# Patient Record
Sex: Female | Born: 1960 | Race: Black or African American | Hispanic: No | Marital: Single | Smoking: Never smoker
Health system: Southern US, Community
[De-identification: ages and names within clinical notes are randomized; demographics above are authoritative.]

## PROBLEM LIST (undated history)

## (undated) DIAGNOSIS — E039 Hypothyroidism, unspecified: Secondary | ICD-10-CM

## (undated) DIAGNOSIS — F988 Other specified behavioral and emotional disorders with onset usually occurring in childhood and adolescence: Secondary | ICD-10-CM

## (undated) DIAGNOSIS — D649 Anemia, unspecified: Secondary | ICD-10-CM

## (undated) DIAGNOSIS — E785 Hyperlipidemia, unspecified: Secondary | ICD-10-CM

## (undated) DIAGNOSIS — R42 Dizziness and giddiness: Secondary | ICD-10-CM

## (undated) DIAGNOSIS — R600 Localized edema: Secondary | ICD-10-CM

## (undated) DIAGNOSIS — I2510 Atherosclerotic heart disease of native coronary artery without angina pectoris: Secondary | ICD-10-CM

## (undated) DIAGNOSIS — M170 Bilateral primary osteoarthritis of knee: Secondary | ICD-10-CM

## (undated) DIAGNOSIS — E079 Disorder of thyroid, unspecified: Secondary | ICD-10-CM

## (undated) DIAGNOSIS — M2550 Pain in unspecified joint: Secondary | ICD-10-CM

## (undated) DIAGNOSIS — C801 Malignant (primary) neoplasm, unspecified: Secondary | ICD-10-CM

## (undated) DIAGNOSIS — Z90710 Acquired absence of both cervix and uterus: Secondary | ICD-10-CM

## (undated) HISTORY — DX: Hypothyroidism, unspecified: E03.9

## (undated) HISTORY — DX: Disorder of thyroid, unspecified: E07.9

## (undated) HISTORY — DX: Pain in unspecified joint: M25.50

## (undated) HISTORY — PX: TONSILLECTOMY: SUR1361

## (undated) HISTORY — DX: Other specified behavioral and emotional disorders with onset usually occurring in childhood and adolescence: F98.8

## (undated) HISTORY — DX: Hyperlipidemia, unspecified: E78.5

## (undated) HISTORY — PX: CATARACT EXTRACTION, BILATERAL: SHX1313

## (undated) HISTORY — PX: COLONOSCOPY: SHX174

## (undated) HISTORY — DX: Dizziness and giddiness: R42

## (undated) HISTORY — PX: ABDOMINAL HYSTERECTOMY: SHX81

## (undated) HISTORY — DX: Bilateral primary osteoarthritis of knee: M17.0

## (undated) HISTORY — DX: Acquired absence of both cervix and uterus: Z90.710

## (undated) HISTORY — DX: Malignant (primary) neoplasm, unspecified: C80.1

## (undated) HISTORY — DX: Localized edema: R60.0

---

## 2004-02-24 ENCOUNTER — Ambulatory Visit (HOSPITAL_COMMUNITY): Admission: RE | Admit: 2004-02-24 | Discharge: 2004-02-24 | Payer: Self-pay | Admitting: Obstetrics & Gynecology

## 2004-03-23 ENCOUNTER — Encounter: Admission: RE | Admit: 2004-03-23 | Discharge: 2004-03-23 | Payer: Self-pay | Admitting: Obstetrics & Gynecology

## 2005-03-29 ENCOUNTER — Encounter: Admission: RE | Admit: 2005-03-29 | Discharge: 2005-03-29 | Payer: Self-pay | Admitting: Obstetrics

## 2006-06-20 ENCOUNTER — Encounter: Admission: RE | Admit: 2006-06-20 | Discharge: 2006-06-20 | Payer: Self-pay | Admitting: Obstetrics

## 2010-03-09 ENCOUNTER — Encounter
Admission: RE | Admit: 2010-03-09 | Discharge: 2010-03-09 | Payer: Self-pay | Source: Home / Self Care | Attending: Family Medicine | Admitting: Family Medicine

## 2011-12-15 ENCOUNTER — Ambulatory Visit (HOSPITAL_COMMUNITY)
Admission: RE | Admit: 2011-12-15 | Discharge: 2011-12-15 | Disposition: A | Discharge status: Home / Self Care | Payer: Worker's Compensation | Source: Ambulatory Visit | Attending: Family Medicine | Admitting: Family Medicine

## 2011-12-15 DIAGNOSIS — M79609 Pain in unspecified limb: Secondary | ICD-10-CM | POA: Insufficient documentation

## 2011-12-15 DIAGNOSIS — M79606 Pain in leg, unspecified: Secondary | ICD-10-CM

## 2011-12-15 DIAGNOSIS — M7989 Other specified soft tissue disorders: Secondary | ICD-10-CM | POA: Insufficient documentation

## 2011-12-15 NOTE — Progress Notes (Signed)
*  PRELIMINARY RESULTS* Vascular Ultrasound Right lower extremity venous duplex has been completed.  Preliminary findings: no evidence of DVT or baker's cyst.  Called report to Toniann Fail at Dr. Prince Rome office.   Farrel Demark, RDMS, RVT  12/15/2011, 11:34 AM

## 2012-07-08 ENCOUNTER — Other Ambulatory Visit: Payer: Self-pay | Admitting: Family Medicine

## 2012-07-08 DIAGNOSIS — E669 Obesity, unspecified: Secondary | ICD-10-CM

## 2012-07-08 DIAGNOSIS — R5383 Other fatigue: Secondary | ICD-10-CM

## 2012-07-09 ENCOUNTER — Ambulatory Visit
Admission: RE | Admit: 2012-07-09 | Discharge: 2012-07-09 | Disposition: A | Discharge status: Home / Self Care | Payer: BC Managed Care – PPO | Source: Ambulatory Visit | Attending: Family Medicine | Admitting: Family Medicine

## 2012-07-09 DIAGNOSIS — R5383 Other fatigue: Secondary | ICD-10-CM

## 2012-07-09 DIAGNOSIS — E669 Obesity, unspecified: Secondary | ICD-10-CM

## 2013-08-25 ENCOUNTER — Other Ambulatory Visit: Payer: Self-pay | Admitting: Family Medicine

## 2013-08-25 DIAGNOSIS — E041 Nontoxic single thyroid nodule: Secondary | ICD-10-CM

## 2013-08-27 ENCOUNTER — Other Ambulatory Visit: Payer: Self-pay

## 2013-08-27 DIAGNOSIS — Z1231 Encounter for screening mammogram for malignant neoplasm of breast: Secondary | ICD-10-CM

## 2013-09-05 ENCOUNTER — Ambulatory Visit
Admission: RE | Admit: 2013-09-05 | Discharge: 2013-09-05 | Disposition: A | Discharge status: Home / Self Care | Payer: BC Managed Care – PPO | Source: Ambulatory Visit | Attending: Family Medicine | Admitting: Family Medicine

## 2013-09-05 ENCOUNTER — Encounter (INDEPENDENT_AMBULATORY_CARE_PROVIDER_SITE_OTHER): Payer: Self-pay

## 2013-09-05 ENCOUNTER — Ambulatory Visit
Admission: RE | Admit: 2013-09-05 | Discharge: 2013-09-05 | Disposition: A | Discharge status: Home / Self Care | Payer: BC Managed Care – PPO | Source: Ambulatory Visit

## 2013-09-05 DIAGNOSIS — Z1231 Encounter for screening mammogram for malignant neoplasm of breast: Secondary | ICD-10-CM

## 2013-09-05 DIAGNOSIS — E041 Nontoxic single thyroid nodule: Secondary | ICD-10-CM

## 2013-09-09 ENCOUNTER — Other Ambulatory Visit: Payer: Self-pay | Admitting: Family Medicine

## 2013-09-09 DIAGNOSIS — R928 Other abnormal and inconclusive findings on diagnostic imaging of breast: Secondary | ICD-10-CM

## 2013-09-17 ENCOUNTER — Ambulatory Visit
Admission: RE | Admit: 2013-09-17 | Discharge: 2013-09-17 | Disposition: A | Discharge status: Home / Self Care | Payer: BC Managed Care – PPO | Source: Ambulatory Visit | Attending: Family Medicine | Admitting: Family Medicine

## 2013-09-17 DIAGNOSIS — R928 Other abnormal and inconclusive findings on diagnostic imaging of breast: Secondary | ICD-10-CM

## 2014-03-11 ENCOUNTER — Other Ambulatory Visit: Payer: Self-pay | Admitting: Family Medicine

## 2014-03-11 DIAGNOSIS — R921 Mammographic calcification found on diagnostic imaging of breast: Secondary | ICD-10-CM

## 2014-03-26 ENCOUNTER — Ambulatory Visit
Admission: RE | Admit: 2014-03-26 | Discharge: 2014-03-26 | Disposition: A | Discharge status: Home / Self Care | Payer: BC Managed Care – PPO | Source: Ambulatory Visit | Attending: Family Medicine | Admitting: Family Medicine

## 2014-03-26 DIAGNOSIS — R921 Mammographic calcification found on diagnostic imaging of breast: Secondary | ICD-10-CM

## 2014-08-31 ENCOUNTER — Other Ambulatory Visit: Payer: Self-pay | Admitting: Family Medicine

## 2014-08-31 DIAGNOSIS — R921 Mammographic calcification found on diagnostic imaging of breast: Secondary | ICD-10-CM

## 2014-09-25 ENCOUNTER — Ambulatory Visit
Admission: RE | Admit: 2014-09-25 | Discharge: 2014-09-25 | Disposition: A | Discharge status: Home / Self Care | Payer: BC Managed Care – PPO | Source: Ambulatory Visit | Attending: Family Medicine | Admitting: Family Medicine

## 2014-09-25 ENCOUNTER — Other Ambulatory Visit: Payer: Self-pay | Admitting: Family Medicine

## 2014-09-25 DIAGNOSIS — R921 Mammographic calcification found on diagnostic imaging of breast: Secondary | ICD-10-CM

## 2015-09-08 ENCOUNTER — Other Ambulatory Visit: Payer: Self-pay | Admitting: Family Medicine

## 2015-09-08 DIAGNOSIS — E042 Nontoxic multinodular goiter: Secondary | ICD-10-CM

## 2015-09-22 ENCOUNTER — Ambulatory Visit
Admission: RE | Admit: 2015-09-22 | Discharge: 2015-09-22 | Disposition: A | Discharge status: Home / Self Care | Payer: BC Managed Care – PPO | Source: Ambulatory Visit | Attending: Family Medicine | Admitting: Family Medicine

## 2015-09-22 DIAGNOSIS — E042 Nontoxic multinodular goiter: Secondary | ICD-10-CM

## 2015-10-25 ENCOUNTER — Other Ambulatory Visit: Payer: Self-pay | Admitting: Internal Medicine

## 2015-10-25 DIAGNOSIS — R921 Mammographic calcification found on diagnostic imaging of breast: Secondary | ICD-10-CM

## 2015-11-04 ENCOUNTER — Other Ambulatory Visit: Payer: Self-pay | Admitting: Family Medicine

## 2015-11-04 ENCOUNTER — Ambulatory Visit
Admission: RE | Admit: 2015-11-04 | Discharge: 2015-11-04 | Disposition: A | Discharge status: Home / Self Care | Payer: BC Managed Care – PPO | Source: Ambulatory Visit | Attending: Internal Medicine | Admitting: Internal Medicine

## 2015-11-04 DIAGNOSIS — R921 Mammographic calcification found on diagnostic imaging of breast: Secondary | ICD-10-CM

## 2016-03-27 ENCOUNTER — Other Ambulatory Visit: Payer: Self-pay | Admitting: Gastroenterology

## 2016-03-27 DIAGNOSIS — R1010 Upper abdominal pain, unspecified: Secondary | ICD-10-CM

## 2016-05-05 ENCOUNTER — Other Ambulatory Visit: Payer: Self-pay

## 2016-05-08 ENCOUNTER — Inpatient Hospital Stay: Admission: RE | Admit: 2016-05-08 | Payer: BC Managed Care – PPO | Source: Ambulatory Visit

## 2016-05-15 ENCOUNTER — Ambulatory Visit: Payer: BC Managed Care – PPO | Admitting: Internal Medicine

## 2016-06-01 ENCOUNTER — Encounter: Payer: Self-pay | Admitting: Internal Medicine

## 2016-06-12 ENCOUNTER — Ambulatory Visit (INDEPENDENT_AMBULATORY_CARE_PROVIDER_SITE_OTHER): Payer: BC Managed Care – PPO | Admitting: Internal Medicine

## 2016-06-12 ENCOUNTER — Encounter (INDEPENDENT_AMBULATORY_CARE_PROVIDER_SITE_OTHER): Payer: Self-pay

## 2016-06-12 ENCOUNTER — Encounter: Payer: Self-pay | Admitting: Internal Medicine

## 2016-06-12 VITALS — BP 142/84 | HR 63 | Ht 63.5 in | Wt 265.8 lb

## 2016-06-12 DIAGNOSIS — R0600 Dyspnea, unspecified: Secondary | ICD-10-CM | POA: Diagnosis not present

## 2016-06-12 DIAGNOSIS — R079 Chest pain, unspecified: Secondary | ICD-10-CM

## 2016-06-12 DIAGNOSIS — R0602 Shortness of breath: Secondary | ICD-10-CM | POA: Diagnosis not present

## 2016-06-12 NOTE — Progress Notes (Signed)
Cardiology Office Note   Date:  06/12/2016   ID:  Kelita Wallis, DOB [birth year], MRN [id number]  DDU:KGUR, Arbie Cookey    Cardiologist:   Dorris Carnes, MD   Pt referred by Beckie Salts for CP      History of Present Illness: Samantha [name] is a 56 y.o. female with a history of GERD  Seen in [facility] There she complained of R sided chest pain  Pressure  Gas-like  On and off  Meds help (nexium)    On talking to the pt she says that she had one signif episode  Went to Urgent care  Bad tightness in center of chest  Lasted 5 or 6 days Continous   Rx Nexium Pt is worried because it lasted for a long time   Continued on Nexium  No tightness   Pt says she does get Fatigue and SOB with activties  If walks a mile or up stairs will get SOB    Has been on lipitor for a couple years  Doesn't take every day as it makes her joiints hurt    Current Meds  Medication Sig  . acetaminophen (TYLENOL ARTHRITIS PAIN) 650 MG CR tablet Take 650 mg by mouth 2 (two) times daily.  Marland Kitchen atorvastatin (LIPITOR) 20 MG tablet Take 20 mg by mouth daily.  Marland Kitchen esomeprazole (NEXIUM) 40 MG capsule Take 40 mg by mouth daily at 12 noon.  Marland Kitchen levothyroxine (SYNTHROID, LEVOTHROID) 88 MCG tablet Take 88 mcg by mouth daily before breakfast.  . nystatin cream (MYCOSTATIN) Apply 1 application topically 2 (two) times daily.     Allergies:   Patient has no known allergies.   Past Medical History:  Diagnosis Date  . Cancer [facility])    Colon Cancer adn polyps  . History of hysterectomy    patrial hysterectomy  . Hyperlipidemia   . Hypothyroidism (acquired)    Multiple thyroid nodules  . Thyroid disease    Hypothyroidism  . Vertigo     History reviewed. No pertinent surgical history.   Social History:  The patient  reports that she has never smoked. She has never used smokeless tobacco. She reports that she does not drink alcohol or use drugs.   Family History:  The patient's    FHx :  On fathers side grandparnts had heart dz No CAD in mom  or dad  Chol elevated in monm     ROS:  Please see the history of present illness. All other systems are reviewed and  Negative to the above problem except as noted.   Occasional food gets stuck     PHYSICAL EXAM: VS:  BP (!) 142/84   Pulse 63   Ht 5' 3.5" (1.613 m)   Wt 265 lb 12.8 oz (120.6 kg)   BMI 46.35 kg/m   GEN: Well nourished, well developed, in no acute distress  HEENT: normal  Neck: no JVD, carotid bruits, or masses Cardiac: RRR; no murmurs, rubs, or gallops,no edema  Respiratory:  clear to auscultation bilaterally, normal work of breathing GI: soft, nontender, nondistended, + BS  No hepatomegaly  MS: no deformity Moving all extremities   Skin: warm and dry, no rash Neuro:  Strength and sensation are intact Psych: euthymic mood, full affect   EKG:  EKG is ordered today.  SR 63 bpm      Lipid Panel No results found for: CHOL, TRIG, HDL, CHOLHDL, VLDL, LDLCALC, LDLDIRECT    Wt Readings from Last 3 Encounters:  06/12/16  265 lb 12.8 oz (120.6 kg)      ASSESSMENT AND PLAN:  1  CP   Atypical I think GI  I do not think it represented angina  2  SOB  Will set up for echo to eval diastolic properties  Consider stress test to evaluate exercise tolerance, BP response  3  HL  Will get lipids    2  BP  This is a little high she says  Usually runs lower  Will need to be followed    5.  Obesity  Discussed diet and wt loss.    F/U based on test results    Current medicines are reviewed at length with the patient today.  The patient does not have concerns regarding medicines.  Signed, Dorris Carnes, MD  06/12/2016 12:21 PM    [address], [place], [place]  [address] Phone: [phone]; Fax: [phone]

## 2016-06-12 NOTE — Patient Instructions (Signed)

## 2016-06-26 ENCOUNTER — Ambulatory Visit (HOSPITAL_COMMUNITY): Payer: BC Managed Care – PPO | Attending: Cardiovascular Disease

## 2016-06-26 ENCOUNTER — Other Ambulatory Visit: Payer: Self-pay

## 2016-06-26 DIAGNOSIS — R0602 Shortness of breath: Secondary | ICD-10-CM

## 2016-06-26 DIAGNOSIS — E785 Hyperlipidemia, unspecified: Secondary | ICD-10-CM | POA: Diagnosis not present

## 2016-06-28 ENCOUNTER — Telehealth: Payer: Self-pay | Admitting: Internal Medicine

## 2016-06-28 NOTE — Telephone Encounter (Signed)
Tried to call pt   Received labs from primary MD  LDL was 166 in Feb  HDL 54  The LDL has had extreme variations 116 to 214.   She needs to work to get this down She is on lipitor 20  If taking regularly I would inreased to 80  Goal LDL at least 100.  Would need f/u lipids in 8 wks

## 2016-06-30 ENCOUNTER — Telehealth: Payer: Self-pay | Admitting: *Deleted

## 2016-06-30 DIAGNOSIS — R0602 Shortness of breath: Secondary | ICD-10-CM

## 2016-06-30 DIAGNOSIS — E785 Hyperlipidemia, unspecified: Secondary | ICD-10-CM

## 2016-06-30 NOTE — Telephone Encounter (Signed)
Patient only takes Lipitor 1-2 times per week at most because it causes her joints to hurt.   Reviewed LDL with her.   Advised Dr. Harrington Challenger may want to try another medication.  Reviewed dietary recommendations for lowering LDL.  She is aware we will call her back after Dr. Harrington Challenger reviews.

## 2016-06-30 NOTE — Telephone Encounter (Signed)
Fay Records, MD      06/28/16 8:43 PM  Note    Tried to call pt   Received labs from primary MD  LDL was 166 in Feb  HDL 54  The LDL has had extreme variations 116 to 214.   She needs to work to get this down She is on lipitor 20  If taking regularly I would inreased to 80  Goal LDL at least 100.  Would need f/u lipids in 8 wks

## 2016-06-30 NOTE — Telephone Encounter (Signed)
Try Crestor 10 mg 3x per wk  Very different metabolism than lipitor   F/U lipids in 8 wks

## 2016-06-30 NOTE — Telephone Encounter (Signed)
-----   Message from Samantha Carnes V, MD sent at 06/27/2016  4:43 PM EDT ----- Echo shows normal pumping and relaxing funciton of the heart  Valves normal I would set up for GXT to eval BP and HR response and exercise capacity

## 2016-07-03 MED ORDER — ROSUVASTATIN CALCIUM 10 MG PO TABS: ORAL_TABLET | ORAL | 3 refills | Status: DC

## 2016-07-03 NOTE — Telephone Encounter (Signed)
Left detailed message (DPR) that Dr. Harrington Challenger recommends Crestor 10 mg three times per week.  Advised [facility] would [facility] and we would plan to check lipids in 8 weeks.   Advised to call back if any questions.

## 2016-08-31 ENCOUNTER — Encounter: Payer: Self-pay | Admitting: Internal Medicine

## 2017-01-11 ENCOUNTER — Other Ambulatory Visit: Payer: Self-pay | Admitting: Family Medicine

## 2017-01-11 DIAGNOSIS — Z1231 Encounter for screening mammogram for malignant neoplasm of breast: Secondary | ICD-10-CM

## 2017-02-02 ENCOUNTER — Ambulatory Visit
Admission: RE | Admit: 2017-02-02 | Discharge: 2017-02-02 | Disposition: A | Discharge status: Home / Self Care | Payer: BC Managed Care – PPO | Source: Ambulatory Visit | Attending: Family Medicine | Admitting: Family Medicine

## 2017-02-02 DIAGNOSIS — Z1231 Encounter for screening mammogram for malignant neoplasm of breast: Secondary | ICD-10-CM

## 2017-03-05 ENCOUNTER — Telehealth: Payer: Self-pay | Admitting: Internal Medicine

## 2017-03-05 DIAGNOSIS — R0602 Shortness of breath: Secondary | ICD-10-CM

## 2017-03-05 NOTE — Telephone Encounter (Signed)
Go ahead and sched GXT Check lipids 2 months after taking statin regularly

## 2017-03-05 NOTE — Telephone Encounter (Signed)
LMTCB

## 2017-03-05 NOTE — Telephone Encounter (Signed)
I spoke with patient--pt states she has not been taking rosuvastatin  10 mg 3 X week regularly, she has not had lab done since lab done in Feb 2018 scanned in Epic.  Pt states she will take rosuvastatin 10 mg 3 X week, repeat fasting lipid profile scheduled for 04/29/17.  Pt states she was aware Dr Harrington Challenger recommended GXT but never got a call from out office to schedule. Pt is ready to schedule GXT, states no new cardiac symptoms, some knee pain but she thinks she can walk on treadmill.  Pt advised I will forward to Dr Harrington Challenger for review and okay to go ahead and schedule GXT since it has been several months since first recommended GXT, pt verbalized understanding.

## 2017-03-05 NOTE — Telephone Encounter (Signed)
°  New message ° °Pt verbalized that she is returning call for the rn  °

## 2017-03-05 NOTE — Telephone Encounter (Signed)
New message   Patient calling for lab results from Lipid panel from March. Patient also wants to discuss getting stress test. Please call

## 2017-03-05 NOTE — Telephone Encounter (Signed)
Rodman Key, RN     2:20 PM  Note    Left detailed message (DPR) that Dr. Harrington Challenger recommends Crestor 10 mg three times per week.  Advised [facility] and we would plan to check lipids in 8 weeks.   Advised to call back if any questions.         2:20 PM     Rodman Key, RN contacted Samantha, [name]  June 30, 2016  Fay Records, MD  to Rodman Key, RN    Note    Try Crestor 10 mg 3x per wk  Very different metabolism than lipitor   F/U lipids in 8 wks

## 2017-03-05 NOTE — Telephone Encounter (Signed)
Note    ----- Message from Fay Records, MD sent at 06/27/2016  4:43 PM EDT ----- Echo shows normal pumping and relaxing funciton of the heart  Valves normal I would set up for GXT to eval BP and HR response and exercise capacity

## 2017-03-06 NOTE — Telephone Encounter (Signed)
LMTCB

## 2017-03-06 NOTE — Telephone Encounter (Signed)
Discussed with patient, reviewed instructions for GXT with patient, will forward message to [facility] to contact pt to schedule.

## 2017-03-07 NOTE — Telephone Encounter (Signed)
GXT is scheduled for 03/16/17.  Pt has made appointment to repeat lipid panel on 04/30/17.

## 2017-03-16 ENCOUNTER — Ambulatory Visit (INDEPENDENT_AMBULATORY_CARE_PROVIDER_SITE_OTHER): Payer: BC Managed Care – PPO

## 2017-03-16 DIAGNOSIS — R0602 Shortness of breath: Secondary | ICD-10-CM

## 2017-03-16 LAB — EXERCISE TOLERANCE TEST
CHL CUP RESTING HR STRESS: 77 {beats}/min
CHL RATE OF PERCEIVED EXERTION: 16
CSEPED: 3 min
CSEPEW: 4.6 METS
Exercise duration (sec): 0 s
MPHR: 162 {beats}/min
Peak HR: 148 {beats}/min
Percent HR: 91 %

## 2017-03-19 ENCOUNTER — Telehealth: Payer: Self-pay | Admitting: *Deleted

## 2017-03-19 MED ORDER — AMLODIPINE BESYLATE 2.5 MG PO TABS: 2.5000 mg | ORAL_TABLET | Freq: Every day | ORAL | 3 refills | Status: DC

## 2017-03-19 NOTE — Telephone Encounter (Signed)
Spoke with pt and went over results and recommendations per Dr. Harrington Challenger.  Pt verbalized understanding and was in agreement with this plan.  Sent prescription to pt's pharmacy and scheduled her to see Dr. Harrington Challenger in March.  Pt appreciative for call.

## 2017-03-19 NOTE — Telephone Encounter (Signed)
-----   Message from Fay Records, MD sent at 03/17/2017  6:43 PM EST ----- Exercise stress test:  No EKG evid for ischemia or poor blood flow to heart Very poor exercise tolerance  Blood pressure did jump up fast Recomm:  1.  2.5 mg amlodipine 2  Pt needs to start walking more often 3  F/U appt in march

## 2017-04-30 ENCOUNTER — Other Ambulatory Visit: Payer: BC Managed Care – PPO

## 2017-05-09 ENCOUNTER — Telehealth: Payer: Self-pay | Admitting: Internal Medicine

## 2017-05-09 NOTE — Telephone Encounter (Signed)
See result note labs 04/27/17.

## 2017-05-09 NOTE — Telephone Encounter (Signed)
New Message   Patient is returning call in reference to recommendations from Dr. Harrington Challenger. Please call to discuss.

## 2017-05-15 ENCOUNTER — Ambulatory Visit (INDEPENDENT_AMBULATORY_CARE_PROVIDER_SITE_OTHER): Payer: BC Managed Care – PPO | Admitting: Pharmacist

## 2017-05-15 ENCOUNTER — Encounter: Payer: Self-pay | Admitting: Pharmacist

## 2017-05-15 DIAGNOSIS — E785 Hyperlipidemia, unspecified: Secondary | ICD-10-CM

## 2017-05-15 NOTE — Patient Instructions (Addendum)
We will pursue Repatha for cholesterol treatment. Call [phone] with questions or concerns.    Cholesterol Cholesterol is a fat. Your body needs a small amount of cholesterol. Cholesterol (plaque) may build up in your blood vessels (arteries). That makes you more likely to have a heart attack or stroke. You cannot feel your cholesterol level. Having a blood test is the only way to find out if your level is high. Keep your test results. Work with your doctor to keep your cholesterol at a good level. What do the results mean?  Total cholesterol is how much cholesterol is in your blood.  LDL is bad cholesterol. This is the type that can build up. Try to have low LDL.  HDL is good cholesterol. It cleans your blood vessels and carries LDL away. Try to have high HDL.  Triglycerides are fat that the body can store or burn for energy. What are good levels of cholesterol?  Total cholesterol below 200.  LDL below 100 is good for people who have health risks. LDL below 70 is good for people who have very high risks.  HDL above 40 is good. It is best to have HDL of 60 or higher.  Triglycerides below 150. How can I lower my cholesterol? Diet Follow your diet program as told by your doctor.  Choose fish, white meat chicken, or [place] that is roasted or baked. Try not to eat red meat, fried foods, sausage, or lunch meats.  Eat lots of fresh fruits and vegetables.  Choose whole grains, beans, pasta, potatoes, and cereals.  Choose olive oil, corn oil, or canola oil. Only use small amounts.  Try not to eat butter, mayonnaise, shortening, or palm kernel oils.  Try not to eat foods with trans fats.  Choose low-fat or nonfat dairy foods. ? Drink skim or nonfat milk. ? Eat low-fat or nonfat yogurt and cheeses. ? Try not to drink whole milk or cream. ? Try not to eat ice cream, egg yolks, or full-fat cheeses.  Healthy desserts include angel food cake, ginger snaps, animal crackers, hard  candy, popsicles, and low-fat or nonfat frozen yogurt. Try not to eat pastries, cakes, pies, and cookies.  Exercise Follow your exercise program as told by your doctor.  Be more active. Try gardening, walking, and taking the stairs.  Ask your doctor about ways that you can be more active.  Medicine  Take over-the-counter and prescription medicines only as told by your doctor. This information is not intended to replace advice given to you by your health care provider. Make sure you discuss any questions you have with your health care provider. Document Released: 06/02/2008 Document Revised: 10/06/2015 Document Reviewed: 09/16/2015 Elsevier Interactive Patient Education  [employer].

## 2017-05-15 NOTE — Progress Notes (Signed)
Patient ID: Samantha [name]                 DOB: May 22, 1960                    MRN: [id number]     HPI: Samantha [name] is a 57 y.o. female patient of Dr. Harrington Challenger that presents today for lipid evaluation.  PMH includes hyperlipidemia. She reports that her mother had high cholesterol and her son was just diagnosed with high cholesterol as well. Her current 10 year risk of ASCVD is 11.5%, indicating that aggressive cholesterol lowering is warranted.   She presents today for discussion of cholesterol. She reports that she has terrible sleeping habits and has been worried about her cholesterol. She reports that she develops whole body aches when she takes statin medications even intermittently. She states this body aching is intolerable. She reports that these aches resolve completely when the medications are stopped.   Risk Factors: hypertension  LDL Goal: <100 (with ASCVD 10-year risk   Current Medications: none Intolerances: Crestor 10mg  daily and every other day (muscle and whole body to hurt), atorvastatin 20mg  daily (muscle and whole body to hurt) - symptoms resolved after discontinuation   Diet: Endorses eating out a lot. She has tried to do better, but it is hard with traveling for work. She eats mostly at sit down restaurants and some fast food. She does eat red meats and chicken. She eats meats grilled mostly. She avoids butter and tries to be mindful of cholesterol intake. She does eat vegetables regularly. She drinks mostly water or coffee with creamer and sometimes sugar (generally 4- 5 cups per day). She admits that portion size can be problematic.   Exercise: No exercise. She would like to start.   Family History: On fathers side grandparents had heart dz, No CAD in mom or dad,  Chol elevated in mom. Son just diagnosed with high cholesterol.  Social History: The patient  reports that she has never smoked. She has never used smokeless tobacco. She reports that she does not drink alcohol or  use drugs.  Labs: 04/27/17: TC 284, TG 127, HDL 52, LDL 207, nonHDL 232 (Crestor 10mg  three times a week - had been taking intermittently due to side effects)   Past Medical History:  Diagnosis Date  . Cancer [facility])    Colon Cancer adn polyps  . History of hysterectomy    patrial hysterectomy  . Hyperlipidemia   . Hypothyroidism (acquired)    Multiple thyroid nodules  . Thyroid disease    Hypothyroidism  . Vertigo     Current Outpatient Medications on File Prior to Visit  Medication Sig Dispense Refill  . acetaminophen (TYLENOL ARTHRITIS PAIN) 650 MG CR tablet Take 650 mg by mouth 2 (two) times daily.    Marland Kitchen amLODipine (NORVASC) 2.5 MG tablet Take 1 tablet (2.5 mg total) by mouth daily. 90 tablet 3  . esomeprazole (NEXIUM) 40 MG capsule Take 40 mg by mouth daily at 12 noon.    Marland Kitchen levothyroxine (SYNTHROID, LEVOTHROID) 88 MCG tablet Take 88 mcg by mouth daily before breakfast.    . nystatin cream (MYCOSTATIN) Apply 1 application topically 2 (two) times daily.    . rosuvastatin (CRESTOR) 10 MG tablet Take 10 mg three times per week 30 tablet 3  . Vitamin D, Ergocalciferol, (DRISDOL) 50000 units CAPS capsule TAKE 1 CAPSULE ONCE WEEKLY FOR 8 WEEKS ORALLY 60 DAYS  0   No current facility-administered medications on file  prior to visit.     No Known Allergies  Assessment/Plan: Hyperlipidemia: LDL not at goal <100 for aggressive management in primary hyperlipidemia. She is statin difficult and Zetia monotherapy is unlikely to get LDL to goal. Will pursue Repatha for LDL lowering and CVD risk reduction. Patient educated on injection technique, side effects and benefits of therapy. We will contact her once coverage obtained from insurance.    Thank you,  Lelan Pons. Patterson Hammersmith, [facility]  05/15/2017 7:00 AM

## 2017-05-17 ENCOUNTER — Telehealth: Payer: Self-pay | Admitting: Pharmacist

## 2017-05-17 DIAGNOSIS — E782 Mixed hyperlipidemia: Secondary | ICD-10-CM

## 2017-05-17 MED ORDER — EVOLOCUMAB 140 MG/ML ~~LOC~~ SOAJ: 140.0000 mg | SUBCUTANEOUS | 3 refills | Status: DC

## 2017-05-17 NOTE — Telephone Encounter (Signed)
Pt approved for Repatha. Will send to [facility] per patient request. She would like to come to office to do first injection and will call when she has received medication.

## 2017-06-07 ENCOUNTER — Ambulatory Visit: Payer: BC Managed Care – PPO | Admitting: Internal Medicine

## 2017-06-07 DIAGNOSIS — R0989 Other specified symptoms and signs involving the circulatory and respiratory systems: Secondary | ICD-10-CM

## 2017-06-07 NOTE — Telephone Encounter (Signed)
LMOM for pt to see if she has started Repatha injections yet. Will need f/u lab work scheduled.

## 2017-06-08 ENCOUNTER — Encounter: Payer: Self-pay | Admitting: Internal Medicine

## 2017-07-10 NOTE — Addendum Note (Signed)
Addended by: Lathaniel Legate E on: 07/10/2017 02:45 PM   Modules accepted: Orders

## 2017-07-10 NOTE — Telephone Encounter (Signed)
Called pt to follow up with Repatha. She states she has completed 1 injection about 2 weeks ago with no adverse effects. Scheduled f/u lab work after 3-4 injections to assess efficacy.

## 2017-07-31 ENCOUNTER — Ambulatory Visit
Admission: RE | Admit: 2017-07-31 | Discharge: 2017-07-31 | Disposition: A | Discharge status: Home / Self Care | Payer: BC Managed Care – PPO | Source: Ambulatory Visit | Attending: Family Medicine | Admitting: Family Medicine

## 2017-07-31 ENCOUNTER — Other Ambulatory Visit: Payer: Self-pay | Admitting: Family Medicine

## 2017-07-31 DIAGNOSIS — M545 Low back pain: Secondary | ICD-10-CM

## 2017-08-09 ENCOUNTER — Other Ambulatory Visit: Payer: BC Managed Care – PPO | Admitting: *Deleted

## 2017-08-09 DIAGNOSIS — E782 Mixed hyperlipidemia: Secondary | ICD-10-CM

## 2017-08-09 LAB — HEPATIC FUNCTION PANEL
ALK PHOS: 80 IU/L (ref 39–117)
ALT: 20 IU/L (ref 0–32)
AST: 18 IU/L (ref 0–40)
Albumin: 3.8 g/dL (ref 3.5–5.5)
Bilirubin Total: 0.2 mg/dL (ref 0.0–1.2)
Bilirubin, Direct: 0.1 mg/dL (ref 0.00–0.40)
Total Protein: 6.4 g/dL (ref 6.0–8.5)

## 2017-08-09 LAB — LIPID PANEL
CHOLESTEROL TOTAL: 143 mg/dL (ref 100–199)
Chol/HDL Ratio: 2.8 ratio (ref 0.0–4.4)
HDL: 52 mg/dL (ref >39)
LDL CALC: 71 mg/dL (ref 0–99)
TRIGLYCERIDES: 100 mg/dL (ref 0–149)
VLDL Cholesterol Cal: 20 mg/dL (ref 5–40)

## 2017-08-17 ENCOUNTER — Ambulatory Visit (INDEPENDENT_AMBULATORY_CARE_PROVIDER_SITE_OTHER): Payer: BC Managed Care – PPO | Admitting: Orthopaedic Surgery

## 2017-08-17 ENCOUNTER — Ambulatory Visit (INDEPENDENT_AMBULATORY_CARE_PROVIDER_SITE_OTHER): Payer: BC Managed Care – PPO

## 2017-08-17 ENCOUNTER — Encounter (INDEPENDENT_AMBULATORY_CARE_PROVIDER_SITE_OTHER): Payer: Self-pay | Admitting: Orthopaedic Surgery

## 2017-08-17 DIAGNOSIS — M25561 Pain in right knee: Secondary | ICD-10-CM

## 2017-08-17 DIAGNOSIS — M25562 Pain in left knee: Secondary | ICD-10-CM

## 2017-08-17 DIAGNOSIS — G8929 Other chronic pain: Secondary | ICD-10-CM | POA: Diagnosis not present

## 2017-08-17 MED ORDER — LIDOCAINE HCL 1 % IJ SOLN
3.0000 mL | INTRAMUSCULAR | Status: AC | PRN
  Administered 2017-08-17: 3 mL

## 2017-08-17 MED ORDER — BUPIVACAINE HCL 0.25 % IJ SOLN
0.6600 mL | INTRAMUSCULAR | Status: AC | PRN
  Administered 2017-08-17: .66 mL via INTRA_ARTICULAR

## 2017-08-17 MED ORDER — METHYLPREDNISOLONE ACETATE 40 MG/ML IJ SUSP
13.3300 mg | INTRAMUSCULAR | Status: AC | PRN
  Administered 2017-08-17: 13.33 mg via INTRA_ARTICULAR

## 2017-08-17 NOTE — Progress Notes (Signed)
Office Visit Note   Patient: Samantha [name]           Date of Birth: [birth year]           MRN: [id number] Visit Date: 08/17/2017              Requested by: Maurice Small, MD [address] [place], [place] [address] PCP: Maurice Small, MD   Assessment & Plan: Visit Diagnoses:  1. Chronic pain of both knees     Plan: Impression is bilateral knee primary localized osteoarthritis and left sided lumbar radiculopathy.  We will reinject both knees with cortisone today.  I believe her back pain is aggravated by the antalgic gait caused by her knee pain.  Hopefully this will resolve with the cortisone injections.  In the meantime, she will make all efforts at weight loss.  She will follow-up with [place] on an as-needed basis.  Follow-Up Instructions: Return if symptoms worsen or fail to improve.   Orders:  Orders Placed This Encounter  Procedures  . Large Joint Inj: bilateral knee  . XR KNEE 3 VIEW LEFT  . XR KNEE 3 VIEW RIGHT   No orders of the defined types were placed in this encounter.     Procedures: Large Joint Inj: bilateral knee on 08/17/2017 8:56 AM Indications: pain Details: 22 G needle, anterolateral approach Medications (Right): 0.66 mL bupivacaine 0.25 %; 3 mL lidocaine 1 %; 13.33 mg methylPREDNISolone acetate 40 MG/ML Medications (Left): 0.66 mL bupivacaine 0.25 %; 3 mL lidocaine 1 %; 13.33 mg methylPREDNISolone acetate 40 MG/ML      Clinical Data: No additional findings.   Subjective: Chief Complaint  Patient presents with  . Lower Back - Pain  . Right Knee - Pain  . Left Knee - Pain    HPI patient is a pleasant 57 year old female who presents to our clinic today with bilateral knee pain.  This began approximately 6 to 7 months ago and has progressively worsened.  The pain she has anteromedial.  She does note some pain to her left buttocks radiating down her left leg.  No groin or anterior thigh pain.  The pain in the knees is intermittent and is  described as a deep-seated ache.  Pain is worse going downstairs as well as first thing in the morning as well as sitting for long periods of time.  She has taken Tylenol as well as ibuprofen with mild relief of symptoms.  No numbness, tingling or burning to the left lower extremity.  She has been doing physical exercise with a trainer as she is trying to lose weight.  She does note remote cortisone injections to both knees back in 2015.  No previous surgical intervention.  No previous back or hip pathology.  She was seen by her primary care provider where x-rays of her back and hips were done.  I have reviewed these which show moderate joint space narrowing to both hips as well as facet arthropathy L4-5 and L5-S1.  Review of Systems as detailed in HPI.  All others reviewed and are negative.   Objective: Vital Signs: There were no vitals taken for this visit.  Physical Exam well-developed well-nourished female in no acute distress.  Alert and oriented x3.  BMI of 46.4  Ortho Exam examination of both knees show no effusion.  Range of motion 0 to 100 degrees.  Moderate patellofemoral crepitus.  Medial joint line tenderness on the left.  Negative logroll negative straight leg raise both sides.  She is neurovascularly intact distally.  Specialty Comments:  No specialty comments available.  Imaging: Xr Knee 3 View Left  Result Date: 08/17/2017 Moderate medial and patellofemoral joint space narrowing  Xr Knee 3 View Right  Result Date: 08/17/2017 Moderate medial and patellofemoral joint space narrowing    PMFS History: Patient Active Problem List   Diagnosis Date Noted  . Chronic pain of both knees 08/17/2017   Past Medical History:  Diagnosis Date  . Cancer [facility])    Colon Cancer adn polyps  . History of hysterectomy    patrial hysterectomy  . Hyperlipidemia   . Hypothyroidism (acquired)    Multiple thyroid nodules  . Thyroid disease    Hypothyroidism  . Vertigo     History  reviewed. No pertinent family history.  Past Surgical History:  Procedure Laterality Date  . ABDOMINAL HYSTERECTOMY     Social History   Occupational History  . Not on file  Tobacco Use  . Smoking status: Never Smoker  . Smokeless tobacco: Never Used  Substance and Sexual Activity  . Alcohol use: No  . Drug use: No  . Sexual activity: Not on file

## 2018-04-15 ENCOUNTER — Other Ambulatory Visit: Payer: Self-pay | Admitting: Internal Medicine

## 2018-04-26 ENCOUNTER — Other Ambulatory Visit: Payer: Self-pay | Admitting: Internal Medicine

## 2018-05-07 ENCOUNTER — Other Ambulatory Visit: Payer: Self-pay | Admitting: Family Medicine

## 2018-05-07 DIAGNOSIS — Z1231 Encounter for screening mammogram for malignant neoplasm of breast: Secondary | ICD-10-CM

## 2018-05-09 ENCOUNTER — Ambulatory Visit (INDEPENDENT_AMBULATORY_CARE_PROVIDER_SITE_OTHER): Payer: BC Managed Care – PPO | Admitting: Orthopaedic Surgery

## 2018-05-09 ENCOUNTER — Ambulatory Visit (INDEPENDENT_AMBULATORY_CARE_PROVIDER_SITE_OTHER): Payer: BC Managed Care – PPO

## 2018-05-09 ENCOUNTER — Encounter (INDEPENDENT_AMBULATORY_CARE_PROVIDER_SITE_OTHER): Payer: Self-pay | Admitting: Orthopaedic Surgery

## 2018-05-09 DIAGNOSIS — M25552 Pain in left hip: Secondary | ICD-10-CM

## 2018-05-09 DIAGNOSIS — G8929 Other chronic pain: Secondary | ICD-10-CM

## 2018-05-09 DIAGNOSIS — M25551 Pain in right hip: Secondary | ICD-10-CM | POA: Diagnosis not present

## 2018-05-09 DIAGNOSIS — M25561 Pain in right knee: Secondary | ICD-10-CM | POA: Diagnosis not present

## 2018-05-09 DIAGNOSIS — M1611 Unilateral primary osteoarthritis, right hip: Secondary | ICD-10-CM | POA: Diagnosis not present

## 2018-05-09 DIAGNOSIS — M25562 Pain in left knee: Secondary | ICD-10-CM

## 2018-05-09 DIAGNOSIS — Z6841 Body Mass Index (BMI) 40.0 and over, adult: Secondary | ICD-10-CM | POA: Diagnosis not present

## 2018-05-09 MED ORDER — METHYLPREDNISOLONE ACETATE 40 MG/ML IJ SUSP: 40.0000 mg | Freq: Once | INTRAMUSCULAR | Status: DC

## 2018-05-09 NOTE — Progress Notes (Signed)
Office Visit Note   Patient: Samantha [name]           Date of Birth: [birth year]           MRN: [id number] Visit Date: 05/09/2018              Requested by: Maurice Small, MD [address] [place], [place] [address] PCP: Maurice Small, MD   Assessment & Plan: Visit Diagnoses:  1. Bilateral hip pain   2. Chronic pain of both knees   3. Morbid obesity ([place])   4. Body mass index 40.0-44.9, adult (HCC)     Plan: Impression is bilateral hip osteoarthritis right greater than left.  We will refer the patient to Dr. Junius Roads for an ultrasound-guided cortisone injection to the right hip joint.  She gets significant relief and would like to proceed with left hip cortisone injection, she will call and let us know and we will set her up with Dr. Junius Roads for that.  She will otherwise follow-up with [place] as needed.  Follow-Up Instructions: Return if symptoms worsen or fail to improve.   Orders:  Orders Placed This Encounter  Procedures  . XR HIPS BILAT W OR W/O PELVIS 3-4 VIEWS   No orders of the defined types were placed in this encounter.     Procedures: No procedures performed   Clinical Data: No additional findings.   Subjective: Chief Complaint  Patient presents with  . Left Hip - Pain  . Right Hip - Pain    HPI patient is a pleasant 58 year old female presents our clinic today with bilateral hip pain right greater than left.  This is been ongoing for the past 6 months.  No known injury or change in activity.  The pain she has is to the lateral hip and into the groin.  No anterior thigh or lower leg pain.  She describes this as a constant throb.  This seems to be worse when seated or lying down for long period of time.  She gets some relief with ambulation.  She denies any numbness, tingling or burning.  She does feel at times that her legs are going to give out on her.  Review of Systems as detailed in HPI.  All others reviewed and are negative.   Objective: Vital  Signs: There were no vitals taken for this visit.  Physical Exam well-developed and well-nourished female in no acute distress.  Alert and oriented x3.  Ortho Exam examination of both hips reveals a positive logroll right greater than left.  Negative straight leg raise.  No focal weakness.  She is neurovascular intact distally.  Specialty Comments:  No specialty comments available.  Imaging: Xr Hips Bilat W Or W/o Pelvis 3-4 Views  Result Date: 05/09/2018 Moderate joint space narrowing both hips    PMFS History: Patient Active Problem List   Diagnosis Date Noted  . Bilateral hip pain 05/09/2018  . Body mass index 40.0-44.9, adult ([place]) 05/09/2018  . Morbid obesity ([place]) 05/09/2018  . Chronic pain of both knees 08/17/2017   Past Medical History:  Diagnosis Date  . Cancer [facility])    Colon Cancer adn polyps  . History of hysterectomy    patrial hysterectomy  . Hyperlipidemia   . Hypothyroidism (acquired)    Multiple thyroid nodules  . Thyroid disease    Hypothyroidism  . Vertigo     History reviewed. No pertinent family history.  Past Surgical History:  Procedure Laterality Date  . ABDOMINAL HYSTERECTOMY  Social History   Occupational History  . Not on file  Tobacco Use  . Smoking status: Never Smoker  . Smokeless tobacco: Never Used  Substance and Sexual Activity  . Alcohol use: No  . Drug use: No  . Sexual activity: Not on file       

## 2018-05-09 NOTE — Progress Notes (Signed)
Subjective: She is here with right greater than left hip pain.  Moderate osteoarthritis.  She is here for ultrasound-guided right hip injection.  Objective: Limited passive flexion and internal rotation of her right hip with pain at the extreme.  Procedure: Ultrasound-guided right hip injection: After sterile prep with Betadine, injected 8 cc 1% lidocaine without epinephrine and 40 mg methylprednisolone using a 22-gauge spinal needle, passing the needle through the iliofemoral ligament into the femoral head/neck junction.  Injectate was seen filling the joint capsule.  She had excellent pain relief during the immediate anesthetic phase.  Follow-up as directed.

## 2018-05-10 ENCOUNTER — Other Ambulatory Visit (INDEPENDENT_AMBULATORY_CARE_PROVIDER_SITE_OTHER): Payer: Self-pay

## 2018-05-10 ENCOUNTER — Encounter (INDEPENDENT_AMBULATORY_CARE_PROVIDER_SITE_OTHER): Payer: Self-pay | Admitting: Orthopaedic Surgery

## 2018-05-10 MED ORDER — ACETAMINOPHEN 500 MG PO TABS: ORAL_TABLET | ORAL | 0 refills | Status: DC

## 2018-05-10 NOTE — Telephone Encounter (Signed)
Will you let her know that the anesthetic medicine has worn off and the cortisone can take anywhere from 2 days- 2 weeks to kick in. You can call in tramadol 50-100 mg q6-8 hours prn pain #30 if she needs something stronger than tylenol and advil

## 2018-05-10 NOTE — Telephone Encounter (Signed)
Sure.  Samantha [name] can write 500mg  one tab q4-6 hours prn pain. #60

## 2018-05-19 ENCOUNTER — Other Ambulatory Visit: Payer: Self-pay | Admitting: Internal Medicine

## 2018-05-31 ENCOUNTER — Ambulatory Visit: Payer: BC Managed Care – PPO

## 2018-06-18 ENCOUNTER — Other Ambulatory Visit: Payer: Self-pay | Admitting: Internal Medicine

## 2018-06-25 ENCOUNTER — Ambulatory Visit: Payer: BC Managed Care – PPO

## 2018-07-05 ENCOUNTER — Other Ambulatory Visit: Payer: Self-pay | Admitting: Internal Medicine

## 2018-08-20 ENCOUNTER — Other Ambulatory Visit: Payer: Self-pay

## 2018-08-20 ENCOUNTER — Ambulatory Visit
Admission: RE | Admit: 2018-08-20 | Discharge: 2018-08-20 | Disposition: A | Discharge status: Home / Self Care | Payer: BC Managed Care – PPO | Source: Ambulatory Visit | Attending: Family Medicine | Admitting: Family Medicine

## 2018-08-20 DIAGNOSIS — Z1231 Encounter for screening mammogram for malignant neoplasm of breast: Secondary | ICD-10-CM

## 2018-12-09 ENCOUNTER — Other Ambulatory Visit: Payer: Self-pay

## 2018-12-09 ENCOUNTER — Telehealth: Payer: Self-pay

## 2018-12-09 DIAGNOSIS — E785 Hyperlipidemia, unspecified: Secondary | ICD-10-CM

## 2018-12-09 NOTE — Telephone Encounter (Signed)
Called and lmomed the pt regarding needing lipid labs await callback orders placed for lipid and hepatic

## 2018-12-16 ENCOUNTER — Telehealth: Payer: Self-pay | Admitting: Internal Medicine

## 2018-12-16 NOTE — Telephone Encounter (Signed)
  Patient would like to switch from Dr Harrington Challenger to Dr Oval Linsey

## 2018-12-16 NOTE — Telephone Encounter (Signed)
OK with me.

## 2019-01-21 ENCOUNTER — Other Ambulatory Visit: Payer: Self-pay

## 2019-01-21 DIAGNOSIS — Z20822 Contact with and (suspected) exposure to covid-19: Secondary | ICD-10-CM

## 2019-01-22 LAB — NOVEL CORONAVIRUS, NAA: SARS-CoV-2, NAA: NOT DETECTED

## 2019-02-20 NOTE — Progress Notes (Deleted)
Cardiology Office Note   Date:  02/20/2019   ID:  [name], DOB May 09, 1960, MRN [id number]  PCP:  Maurice Small, MD  Cardiologist:   Skeet Latch, MD   No chief complaint on file.     History of Present Illness: Samantha [name] is a 58 y.o. female with hyperlipidemia and GERD who presents for ***  She was previously a patient of Dr. Harrington Challenger last seen 05/2016 in the setting of atypical chest pain.  She also reported fatigue and shortness of breath at that time.  She had an ETT 02/2017 that revealed poor exercise tolerance but was negative for ischemia.  She had hypertensive response and was started on amlodipine 2.5 mg daily.  It was recommended that she follow up in 3 months but hasnt been seen since that time.  She was started on rosuvastatin due to elevated lipids 06/2016.  Past Medical History:  Diagnosis Date  . Cancer [facility])    Colon Cancer adn polyps  . History of hysterectomy    patrial hysterectomy  . Hyperlipidemia   . Hypothyroidism (acquired)    Multiple thyroid nodules  . Thyroid disease    Hypothyroidism  . Vertigo     Past Surgical History:  Procedure Laterality Date  . ABDOMINAL HYSTERECTOMY       Current Outpatient Medications  Medication Sig Dispense Refill  . acetaminophen (TYLENOL ARTHRITIS PAIN) 650 MG CR tablet Take 650 mg by mouth 2 (two) times daily.    Marland Kitchen acetaminophen (TYLENOL) 500 MG tablet 1 po q 4-6 hrs prn 60 tablet 0  . amLODipine (NORVASC) 2.5 MG tablet TAKE 1 TABLET BY MOUTH DAILY. PLEASE MAKE APPT FOR FUTURE REFILLS (3RD & FINAL ATTEMPT) 15 tablet 1  . Evolocumab (REPATHA SURECLICK) XX123456 MG/ML SOAJ Inject 140 mg into the skin every 14 (fourteen) days. 6 pen 3  . levothyroxine (SYNTHROID, LEVOTHROID) 88 MCG tablet Take 88 mcg by mouth daily before breakfast.    . nystatin cream (MYCOSTATIN) Apply 1 application topically 2 (two) times daily.    . Vitamin D, Ergocalciferol, (DRISDOL) 50000 units CAPS capsule TAKE 1 CAPSULE ONCE WEEKLY FOR  8 WEEKS ORALLY 60 DAYS  0   Current Facility-Administered Medications  Medication Dose Route Frequency Provider Last Rate Last Dose  . methylPREDNISolone acetate (DEPO-MEDROL) injection 40 mg  40 mg Intra-articular Once Hilts, Michael, MD        Allergies:   Patient has no known allergies.    Social History:  The patient  reports that she has never smoked. She has never used smokeless tobacco. She reports that she does not drink alcohol or use drugs.   Family History:  The patient's ***family history is not on file.    ROS:  Please see the history of present illness.   Otherwise, review of systems are positive for {NONE DEFAULTED:18576::"none"}.   All other systems are reviewed and negative.    PHYSICAL EXAM: VS:  There were no vitals taken for this visit. , BMI There is no height or weight on file to calculate BMI. GENERAL:  Well appearing HEENT:  Pupils equal round and reactive, fundi not visualized, oral mucosa unremarkable NECK:  No jugular venous distention, waveform within normal limits, carotid upstroke brisk and symmetric, no bruits, no thyromegaly LYMPHATICS:  No cervical adenopathy LUNGS:  Clear to auscultation bilaterally HEART:  RRR.  PMI not displaced or sustained,S1 and S2 within normal limits, no S3, no S4, no clicks, no rubs, *** murmurs ABD:  Flat,  positive bowel sounds normal in frequency in pitch, no bruits, no rebound, no guarding, no midline pulsatile mass, no hepatomegaly, no splenomegaly EXT:  2 plus pulses throughout, no edema, no cyanosis no clubbing SKIN:  No rashes no nodules NEURO:  Cranial nerves II through XII grossly intact, motor grossly intact throughout PSYCH:  Cognitively intact, oriented to person place and time    EKG:  EKG {ACTION; IS/IS [id number] ordered today. The ekg ordered today demonstrates ***  ETT 02/2017:  Blood pressure demonstrated a hypertensive response to exercise.  There was no ST segment deviation noted during stress.   Recent Labs: No results found for requested labs within last 8760 hours.    Lipid Panel    Component Value Date/Time   CHOL 143 08/09/2017 0918   TRIG 100 08/09/2017 0918   HDL 52 08/09/2017 0918   CHOLHDL 2.8 08/09/2017 0918   LDLCALC 71 08/09/2017 0918      Wt Readings from Last 3 Encounters:  06/12/16 265 lb 12.8 oz (120.6 kg)      ASSESSMENT AND PLAN:  ***   Current medicines are reviewed at length with the patient today.  The patient {ACTIONS; HAS/DOES NOT HAVE:19233} concerns regarding medicines.  The following changes have been made:  {PLAN; NO CHANGE:13088:s}  Labs/ tests ordered today include: *** No orders of the defined types were placed in this encounter.    Disposition:   FU with ***     Signed, Nykiah Ma C. Oval Linsey, MD, [facility]  02/20/2019 8:26 AM    [place]

## 2019-02-21 ENCOUNTER — Ambulatory Visit: Payer: BC Managed Care – PPO | Admitting: Cardiovascular Disease

## 2019-04-02 ENCOUNTER — Other Ambulatory Visit: Payer: Self-pay

## 2019-04-02 ENCOUNTER — Ambulatory Visit: Payer: BC Managed Care – PPO | Admitting: Cardiovascular Disease

## 2019-04-02 ENCOUNTER — Encounter: Payer: Self-pay | Admitting: Cardiovascular Disease

## 2019-04-02 VITALS — BP 138/74 | HR 67 | Ht 63.0 in | Wt 273.4 lb

## 2019-04-02 DIAGNOSIS — Z5181 Encounter for therapeutic drug level monitoring: Secondary | ICD-10-CM | POA: Diagnosis not present

## 2019-04-02 DIAGNOSIS — K219 Gastro-esophageal reflux disease without esophagitis: Secondary | ICD-10-CM | POA: Diagnosis not present

## 2019-04-02 DIAGNOSIS — R0789 Other chest pain: Secondary | ICD-10-CM | POA: Diagnosis not present

## 2019-04-02 DIAGNOSIS — I10 Essential (primary) hypertension: Secondary | ICD-10-CM | POA: Diagnosis not present

## 2019-04-02 DIAGNOSIS — E7800 Pure hypercholesterolemia, unspecified: Secondary | ICD-10-CM

## 2019-04-02 MED ORDER — HYDROCHLOROTHIAZIDE 25 MG PO TABS: 25.0000 mg | ORAL_TABLET | Freq: Every day | ORAL | 3 refills | Status: DC

## 2019-04-02 NOTE — Progress Notes (Signed)
Cardiology Office Note   Date:  04/20/2019   ID:  Samantha [name], DOB 1961/01/20, MRN [id number]  PCP:  Maurice Small, MD  Cardiologist:   Skeet Latch, MD   No chief complaint on file.     History of Present Illness: Samantha [name] is a 59 y.o. female with GERD, hypertension, hyperlipidemia, and obesity who presents for follow up. She was initially seen in 2018 with atypical chest pain and shortness of breath.  She ws referred for an echo that was unremarkable.  She also had an ETT that showed poor exercise tolerance.  She was started on amlodipine.  Last week [name] noted some chest pressure.  She associated it with tomato-based food.  Her symptoms have been better since changing her diet.  The only time she has chest presure is when she lays down.  She walks daily for 30 min and has no exertional chest pain or shortness of breath.  She has been trying to lose weight.  She struggles with eating out and eating healthy meals.  She has venous insufficiency and had laser treatment 03/24/18.  She noted skin discoloration.  She wears compression socks and follows up with Dr. Jones Skene at [facility].  However she continues to have edema.  She denies orthopnea or PND.  Her BP control has been variable.   Past Medical History:  Diagnosis Date  . Cancer [facility])    Colon Cancer adn polyps  . Essential hypertension 04/20/2019  . GERD (gastroesophageal reflux disease) 04/20/2019  . History of hysterectomy    patrial hysterectomy  . Hyperlipidemia   . Hypothyroidism (acquired)    Multiple thyroid nodules  . Thyroid disease    Hypothyroidism  . Vertigo     Past Surgical History:  Procedure Laterality Date  . ABDOMINAL HYSTERECTOMY       Current Outpatient Medications  Medication Sig Dispense Refill  . acetaminophen (TYLENOL ARTHRITIS PAIN) 650 MG CR tablet Take 650 mg by mouth 2 (two) times daily.    Marland Kitchen acetaminophen (TYLENOL) 500 MG tablet 1 po q 4-6 hrs prn 60  tablet 0  . levothyroxine (SYNTHROID, LEVOTHROID) 88 MCG tablet Take 88 mcg by mouth daily before breakfast.    . pravastatin (PRAVACHOL) 20 MG tablet Take 20 mg by mouth daily.    . Vitamin D, Ergocalciferol, (DRISDOL) 50000 units CAPS capsule TAKE 1 CAPSULE ONCE WEEKLY FOR 8 WEEKS ORALLY 60 DAYS  0  . Alirocumab (PRALUENT) 150 MG/ML SOAJ Inject 150 mg into the skin every 14 (fourteen) days. 2 pen 11  . hydrochlorothiazide (HYDRODIURIL) 25 MG tablet Take 1 tablet (25 mg total) by mouth daily. 90 tablet 3   Current Facility-Administered Medications  Medication Dose Route Frequency Provider Last Rate Last Admin  . methylPREDNISolone acetate (DEPO-MEDROL) injection 40 mg  40 mg Intra-articular Once Hilts, Michael, MD        Allergies:   Patient has no known allergies.    Social History:  The patient  reports that she has never smoked. She has never used smokeless tobacco. She reports that she does not drink alcohol or use drugs.   Family History:  The patient's family history includes Colon cancer in her maternal grandmother; Glaucoma in her mother; Hearing loss in her mother; Hyperlipidemia in her mother; Thyroid disease in her mother.    ROS:  Please see the history of present illness.   Otherwise, review of systems are positive for none.   All other systems are  reviewed and negative.    PHYSICAL EXAM: VS:  BP 138/74   Pulse 67   Ht 5\' 3"  (1.6 m)   Wt 273 lb 6.4 oz (124 kg)   BMI 48.43 kg/m  , BMI Body mass index is 48.43 kg/m. GENERAL:  Well appearing HEENT:  Pupils equal round and reactive, fundi not visualized, oral mucosa unremarkable NECK:  No jugular venous distention, waveform within normal limits, carotid upstroke brisk and symmetric, no bruits LUNGS:  Clear to auscultation bilaterally HEART:  RRR.  PMI not displaced or sustained,S1 and S2 within normal limits, no S3, no S4, no clicks, no rubs, no murmurs ABD:  Flat, positive bowel sounds normal in frequency in pitch, no  bruits, no rebound, no guarding, no midline pulsatile mass, no hepatomegaly, no splenomegaly EXT:  2 plus pulses throughout, no edema, no cyanosis no clubbing SKIN:  No rashes no nodules NEURO:  Cranial nerves II through XII grossly intact, motor grossly intact throughout PSYCH:  Cognitively intact, oriented to person place and time    EKG:  EKG is ordered today. The ekg ordered today demonstrates sinus rhythm.  Rate 67 bpm.  Nonspecific ST T changes.    Recent Labs: No results found for requested labs within last 8760 hours.    Lipid Panel    Component Value Date/Time   CHOL 143 08/09/2017 0918   TRIG 100 08/09/2017 0918   HDL 52 08/09/2017 0918   CHOLHDL 2.8 08/09/2017 0918   LDLCALC 71 08/09/2017 0918      Wt Readings from Last 3 Encounters:  04/02/19 273 lb 6.4 oz (124 kg)  06/12/16 265 lb 12.8 oz (120.6 kg)      ASSESSMENT AND PLAN:  # Hypertension:  # LE edema: BP is slightly above her goal of <130/80.  She continues to have LE edema despite the fact that she doesn't have heart failure and having been treated for venous insufficiency.  We will try stopping amlodipine.  Start HCTZ 12.5mg .  Check BP in one week.  # Morbid obesity: [name] was encouraged to increase her exercise and choose one dietary change.  Goal is >150 minutes of exercise weekly.  # Hyperlipidemia:  Continue pravastatin.  # Atypical chest pain: Symptoms are not exertional and seem to be attributable to GERD.   Current medicines are reviewed at length with the patient today.  The patient does not have concerns regarding medicines.  The following changes have been made:  Stop HCTZ.  Start amlodipine.  Labs/ tests ordered today include:   Orders Placed This Encounter  Procedures  . Basic metabolic panel  . EKG 12-Lead     Disposition:   FU with Rachael Zapanta C. Oval Linsey, MD, [facility] in 2 months      Signed, Tykeem Lanzer C. Oval Linsey, MD, [facility]  04/20/2019 7:51 PM    [place]

## 2019-04-02 NOTE — Patient Instructions (Addendum)
Medication Instructions:  STOP AMLODIPINE   START HYDROCHLOROTHIAZIDE 25 MG DAILY   *If you need a refill on your cardiac medications before your next appointment, please call your pharmacy*  Lab Work: BMET IN 1 WEEK  If you have labs (blood work) drawn today and your tests are completely normal, you will receive your results only by: Marland Kitchen MyChart Message (if you have MyChart) OR . A paper copy in the mail If you have any lab test that is abnormal or we need to change your treatment, we will call you to review the results.  Testing/Procedures: NONE  Follow-Up: At [facility], you and your health needs are our priority.  As part of our continuing mission to provide you with exceptional heart care, we have created designated Provider Care Teams.  These Care Teams include your primary Cardiologist (physician) and Advanced Practice Providers (APPs -  Physician Assistants and Nurse Practitioners) who all work together to provide you with the care you need, when you need it.  Your next appointment:   2 month(s)  The format for your next appointment:   Virtual Visit   Provider:   You may see DR [facility] or one of the following Advanced Practice Providers on your designated Care Team:    Kerin Ransom, PA-C  [place], [place]  Coletta Memos, [place]

## 2019-04-08 ENCOUNTER — Other Ambulatory Visit: Payer: Self-pay

## 2019-04-10 ENCOUNTER — Telehealth: Payer: Self-pay

## 2019-04-10 MED ORDER — PRALUENT 150 MG/ML ~~LOC~~ SOAJ: 150.0000 mg | SUBCUTANEOUS | 11 refills | Status: DC

## 2019-04-10 NOTE — Telephone Encounter (Signed)
lmomed the pt stated the praluent was approved, rx sent, instructed the pt to call back if they had any questions

## 2019-04-10 NOTE — Progress Notes (Signed)
This encounter was created in error - please disregard.

## 2019-04-18 ENCOUNTER — Telehealth: Payer: Self-pay | Admitting: Cardiovascular Disease

## 2019-04-18 NOTE — Telephone Encounter (Signed)
Yes, she should take both.  Her last LDL was 190, so the combination is much more effective than the Praluent on its own

## 2019-04-18 NOTE — Telephone Encounter (Signed)
New Message:    Pt wants to know if she is supposed to be taking Praluent instead of Provastatin?

## 2019-04-18 NOTE — Telephone Encounter (Signed)
Called patient, LVM advising of message from PharmD. Left call back number.

## 2019-04-18 NOTE — Telephone Encounter (Signed)
Please advise, I thought they normally would stay on the medications with the injections but wanted to check to be sure. Thank you!

## 2019-04-20 ENCOUNTER — Encounter: Payer: Self-pay | Admitting: Cardiovascular Disease

## 2019-04-20 DIAGNOSIS — I10 Essential (primary) hypertension: Secondary | ICD-10-CM

## 2019-04-20 DIAGNOSIS — K219 Gastro-esophageal reflux disease without esophagitis: Secondary | ICD-10-CM

## 2019-04-20 HISTORY — DX: Gastro-esophageal reflux disease without esophagitis: K21.9

## 2019-04-20 HISTORY — DX: Essential (primary) hypertension: I10

## 2019-05-16 ENCOUNTER — Telehealth: Payer: Self-pay | Admitting: Cardiovascular Disease

## 2019-05-16 NOTE — Telephone Encounter (Signed)
Patient calling to find out if she is supposed to be monitoring her BP every day or once a week.  She also wanted to know if Dr. Oval Linsey would be open to doing a zoom call for her Samantha [name] about heart health and the importance of patient advocacy.

## 2019-05-16 NOTE — Telephone Encounter (Signed)
Advised patient couple times week ok for blood pressure per last ov Stated she was coming to office next week for labs Will forward to Dr Oval Linsey regarding zoom call

## 2019-05-21 ENCOUNTER — Telehealth: Payer: BC Managed Care – PPO | Admitting: Cardiovascular Disease

## 2019-07-03 ENCOUNTER — Ambulatory Visit: Payer: BC Managed Care – PPO | Admitting: Cardiovascular Disease

## 2019-07-09 NOTE — Telephone Encounter (Signed)
Agree about your recommendation for the blood pressure monitoring.  I can do a Zoom presentation.  Give her my email please.

## 2019-07-09 NOTE — Telephone Encounter (Signed)
Left message to call back  

## 2019-07-09 NOTE — Telephone Encounter (Signed)
Samantha [name] is returning phone call.

## 2019-07-10 NOTE — Telephone Encounter (Signed)
Left message to just keep follow up tomorrow

## 2019-07-11 ENCOUNTER — Encounter: Payer: Self-pay | Admitting: Cardiovascular Disease

## 2019-07-11 ENCOUNTER — Ambulatory Visit (INDEPENDENT_AMBULATORY_CARE_PROVIDER_SITE_OTHER): Payer: BC Managed Care – PPO | Admitting: Cardiovascular Disease

## 2019-07-11 ENCOUNTER — Other Ambulatory Visit: Payer: Self-pay

## 2019-07-11 VITALS — BP 118/66 | HR 78 | Ht 63.0 in | Wt 267.2 lb

## 2019-07-11 DIAGNOSIS — E7800 Pure hypercholesterolemia, unspecified: Secondary | ICD-10-CM | POA: Diagnosis not present

## 2019-07-11 DIAGNOSIS — I10 Essential (primary) hypertension: Secondary | ICD-10-CM

## 2019-07-11 DIAGNOSIS — Z5181 Encounter for therapeutic drug level monitoring: Secondary | ICD-10-CM

## 2019-07-11 MED ORDER — VALSARTAN 80 MG PO TABS: 80.0000 mg | ORAL_TABLET | Freq: Every day | ORAL | 1 refills | Status: DC

## 2019-07-11 NOTE — Progress Notes (Signed)
Cardiology Office Note   Date:  07/11/2019   ID:  Samantha [name], DOB [birth year], MRN [id number]  PCP:  Maurice Small, MD  Cardiologist:   Skeet Latch, MD   No chief complaint on file.   History of Present Illness: Samantha [name] is a 59 y.o. female with GERD, hypertension, hyperlipidemia, and obesity who presents for follow up. She was initially seen in 2018 with atypical chest pain and shortness of breath.  She ws referred for an echo that was unremarkable.  She also had an ETT that showed poor exercise tolerance.  She was started on amlodipine.  At her last appointment this was stopped due to increased lower extremity edema.  She has venous insufficiency and had laser treatment 03/24/18.  She noted skin discoloration.  She wears compression socks and follows up with Dr. Jones Skene at [facility].  Lately the edema has been better controlled.  At her last appointment she was started on HCTZ due to poorly controlled BP.  The last couple times she checked it at home it was mostly OK.  At times has been as high as the 160s.  She notices that it is also related to dietary intake, especially bacon.  She has been exercising by walking, but not as much lately.  She has no exertional chest pain and her breathing has been stable.  She denies orthopnea or PND.   Past Medical History:  Diagnosis Date  . Cancer [facility])    Colon Cancer adn polyps  . Essential hypertension 04/20/2019  . GERD (gastroesophageal reflux disease) 04/20/2019  . History of hysterectomy    patrial hysterectomy  . Hyperlipidemia   . Hypothyroidism (acquired)    Multiple thyroid nodules  . Thyroid disease    Hypothyroidism  . Vertigo     Past Surgical History:  Procedure Laterality Date  . ABDOMINAL HYSTERECTOMY       Current Outpatient Medications  Medication Sig Dispense Refill  . acetaminophen (TYLENOL) 500 MG tablet 1 po q 4-6 hrs prn 60 tablet 0  . Alirocumab (PRALUENT) 150 MG/ML SOAJ Inject  150 mg into the skin every 14 (fourteen) days. 2 pen 11  . hydrochlorothiazide (HYDRODIURIL) 25 MG tablet Take 1 tablet (25 mg total) by mouth daily. 90 tablet 3  . levothyroxine (SYNTHROID, LEVOTHROID) 88 MCG tablet Take 88 mcg by mouth daily before breakfast.    . pravastatin (PRAVACHOL) 20 MG tablet Take 20 mg by mouth daily.    . Vitamin D, Ergocalciferol, (DRISDOL) 50000 units CAPS capsule TAKE 1 CAPSULE ONCE WEEKLY FOR 8 WEEKS ORALLY 60 DAYS  0  . valsartan (DIOVAN) 80 MG tablet Take 1 tablet (80 mg total) by mouth daily. 90 tablet 1   Current Facility-Administered Medications  Medication Dose Route Frequency Provider Last Rate Last Admin  . methylPREDNISolone acetate (DEPO-MEDROL) injection 40 mg  40 mg Intra-articular Once Hilts, Michael, MD        Allergies:   Patient has no known allergies.    Social History:  The patient  reports that she has never smoked. She has never used smokeless tobacco. She reports that she does not drink alcohol or use drugs.   Family History:  The patient's family history includes Colon cancer in her maternal grandmother; Glaucoma in her mother; Hearing loss in her mother; Hyperlipidemia in her mother; Thyroid disease in her mother.    ROS:  Please see the history of present illness.   Otherwise, review of systems are positive for  none.   All other systems are reviewed and negative.    PHYSICAL EXAM: VS:  BP 118/66   Pulse 78   Ht 5\' 3"  (1.6 m)   Wt 267 lb 3.2 oz (121.2 kg)   SpO2 97%   BMI 47.33 kg/m  , BMI Body mass index is 47.33 kg/m. GENERAL:  Well appearing HEENT: Pupils equal round and reactive, fundi not visualized, oral mucosa unremarkable NECK:  No jugular venous distention, waveform within normal limits, carotid upstroke brisk and symmetric, no bruits LUNGS:  Clear to auscultation bilaterally HEART:  RRR.  PMI not displaced or sustained,S1 and S2 within normal limits, no S3, no S4, no clicks, no rubs, no murmurs ABD:  Flat, positive  bowel sounds normal in frequency in pitch, no bruits, no rebound, no guarding, no midline pulsatile mass, no hepatomegaly, no splenomegaly EXT:  2 plus pulses throughout, no edema, no cyanosis no clubbing SKIN:  No rashes no nodules NEURO:  Cranial nerves II through XII grossly intact, motor grossly intact throughout PSYCH:  Cognitively intact, oriented to person place and time  EKG:  EKG is not ordered today. The ekg ordered 04/02/19 demonstrates sinus rhythm.  Rate 67 bpm.  Nonspecific ST T changes.    Recent Labs: No results found for requested labs within last 8760 hours.    Lipid Panel    Component Value Date/Time   CHOL 143 08/09/2017 0918   TRIG 100 08/09/2017 0918   HDL 52 08/09/2017 0918   CHOLHDL 2.8 08/09/2017 0918   LDLCALC 71 08/09/2017 0918      Wt Readings from Last 3 Encounters:  07/11/19 267 lb 3.2 oz (121.2 kg)  04/02/19 273 lb 6.4 oz (124 kg)  06/12/16 265 lb 12.8 oz (120.6 kg)      ASSESSMENT AND PLAN:  # Hypertension:  # LE edema: Blood pressure was well controlled today.  However at home it has been very labile and mostly running over 130/80.  We discussed checking it after resting for at least 5 minutes.  We will add valsartan 80 mg daily.  Continue HCTZ.  Plan to consolidate once the dose is more certain.  Check a CMP in 1 week.  # Morbid obesity: [name] was encouraged to increase her exercise and choose one dietary change.  Goal is >150 minutes of exercise weekly.  # Hyperlipidemia:  Continue pravastatin and Praluent.  Check lipids and a CMP as above.  # Atypical chest pain: Symptoms are not exertional and seem to be attributable to GERD.   Current medicines are reviewed at length with the patient today.  The patient does not have concerns regarding medicines.  The following changes have been made:Start valsartan  Labs/ tests ordered today include:   Orders Placed This Encounter  Procedures  . Lipid panel  . Comprehensive  metabolic panel     Disposition:   FU with Aashrith Eves C. Oval Linsey, MD, [facility] in 2 months      Signed, Sanayah Munro C. Oval Linsey, MD, [facility]  07/11/2019 6:03 PM    Samantha [name]

## 2019-07-11 NOTE — Patient Instructions (Addendum)
Please email me at [email or web address] regarding the talk for church.  Medication Instructions:  START VALSARTAN 80 MG DAILY   *If you need a refill on your cardiac medications before your next appointment, please call your pharmacy*  Lab Work: FASTING LP/CMET IN 1 WEEK   If you have labs (blood work) drawn today and your tests are completely normal, you will receive your results only by: Marland Kitchen MyChart Message (if you have MyChart) OR . A paper copy in the mail If you have any lab test that is abnormal or we need to change your treatment, we will call you to review the results.  Testing/Procedures: NONE  Follow-Up: At [facility], you and your health needs are our priority.  As part of our continuing mission to provide you with exceptional heart care, we have created designated Provider Care Teams.  These Care Teams include your primary Cardiologist (physician) and Advanced Practice Providers (APPs -  Physician Assistants and Nurse Practitioners) who all work together to provide you with the care you need, when you need it.  We recommend signing up for the patient portal called "MyChart".  Sign up information is provided on this After Visit Summary.  MyChart is used to connect with patients for Virtual Visits (Telemedicine).  Patients are able to view lab/test results, encounter notes, upcoming appointments, etc.  Non-urgent messages can be sent to your provider as well.   To learn more about what you can do with MyChart, go to [email or web address].    Your next appointment:   2 month(s)  The format for your next appointment:   In Person  Provider:   You may see DR [facility]  or one of the following Advanced Practice Providers on your designated Care Team:    Kerin Ransom, PA-C  [place], [place]  Coletta Memos, [place]

## 2019-08-04 LAB — COMPREHENSIVE METABOLIC PANEL
ALT: 20 IU/L (ref 0–32)
AST: 19 IU/L (ref 0–40)
Albumin/Globulin Ratio: 1.5 (ref 1.2–2.2)
Albumin: 4 g/dL (ref 3.8–4.9)
Alkaline Phosphatase: 87 IU/L (ref 48–121)
BUN/Creatinine Ratio: 13 (ref 12–28)
BUN: 12 mg/dL (ref 8–27)
Bilirubin Total: 0.2 mg/dL (ref 0.0–1.2)
CO2: 25 mmol/L (ref 20–29)
Calcium: 9.1 mg/dL (ref 8.7–10.3)
Chloride: 100 mmol/L (ref 96–106)
Creatinine, Ser: 0.96 mg/dL (ref 0.57–1.00)
GFR calc Af Amer: 74 mL/min/{1.73_m2} (ref >59)
GFR calc non Af Amer: 64 mL/min/{1.73_m2} (ref >59)
Globulin, Total: 2.6 g/dL (ref 1.5–4.5)
Glucose: 117 mg/dL — ABNORMAL HIGH (ref 65–99)
Potassium: 4.3 mmol/L (ref 3.5–5.2)
Sodium: 138 mmol/L (ref 134–144)
Total Protein: 6.6 g/dL (ref 6.0–8.5)

## 2019-08-04 LAB — LIPID PANEL
Chol/HDL Ratio: 2.9 ratio (ref 0.0–4.4)
Cholesterol, Total: 167 mg/dL (ref 100–199)
HDL: 58 mg/dL (ref >39)
LDL Chol Calc (NIH): 92 mg/dL (ref 0–99)
Triglycerides: 95 mg/dL (ref 0–149)
VLDL Cholesterol Cal: 17 mg/dL (ref 5–40)

## 2019-08-07 ENCOUNTER — Telehealth: Payer: Self-pay | Admitting: Cardiovascular Disease

## 2019-08-07 NOTE — Telephone Encounter (Signed)
Left message to call back  

## 2019-08-07 NOTE — Telephone Encounter (Signed)
Patient calling for lab results. She also wants to know if Dr. Oval Linsey received her email. She states that she sent Dr. Oval Linsey a message on her personal email about a community town hall meeting. Please advise.

## 2019-08-08 NOTE — Telephone Encounter (Signed)
Spoke with patient and advised will call once Dr Oval Linsey reviews labs Will send message to Dr Oval Linsey regarding email

## 2019-08-08 NOTE — Telephone Encounter (Signed)
Follow up ° ° °Patient is returning your call. Please call. ° ° ° °

## 2019-10-13 ENCOUNTER — Encounter: Payer: Self-pay | Admitting: Cardiovascular Disease

## 2019-10-13 ENCOUNTER — Ambulatory Visit (INDEPENDENT_AMBULATORY_CARE_PROVIDER_SITE_OTHER): Payer: BC Managed Care – PPO | Admitting: Cardiovascular Disease

## 2019-10-13 ENCOUNTER — Other Ambulatory Visit: Payer: Self-pay

## 2019-10-13 VITALS — BP 116/64 | HR 64 | Ht 63.0 in | Wt 269.4 lb

## 2019-10-13 DIAGNOSIS — E7800 Pure hypercholesterolemia, unspecified: Secondary | ICD-10-CM | POA: Diagnosis not present

## 2019-10-13 DIAGNOSIS — Z6841 Body Mass Index (BMI) 40.0 and over, adult: Secondary | ICD-10-CM

## 2019-10-13 DIAGNOSIS — I10 Essential (primary) hypertension: Secondary | ICD-10-CM

## 2019-10-13 DIAGNOSIS — Z5181 Encounter for therapeutic drug level monitoring: Secondary | ICD-10-CM | POA: Diagnosis not present

## 2019-10-13 NOTE — Progress Notes (Signed)
Cardiology Office Note   Date:  10/13/2019   ID:  Samantha [name], DOB 06/17/1960, MRN [id number]  PCP:  Samantha Small, MD  Cardiologist:   Samantha Latch, MD   No chief complaint on file.   History of Present Illness: Samantha [name] is a 59 y.o. female with GERD, hypertension, hyperlipidemia, and obesity who presents for follow up. She was initially seen in 2018 with atypical chest pain and shortness of breath.  She ws referred for an echo that was unremarkable.  She also had an ETT that showed poor exercise tolerance.  She was started on amlodipine.  At her last appointment this was stopped due to increased lower extremity edema.  She has venous insufficiency and had laser treatment 03/24/18.  She noted skin discoloration.  She wears compression socks and follows up with Samantha [name] at [facility].  Lately the edema has been better controlled.  Samantha [name] was started on HCTZ due to poorly controlled BP.   Lately her blood pressure has been well-controlled at home.  It is typically in the 374M systolic.  She was previously on other statins and had a lot of stiffness.  She previously took Repatha and tolerated it well.  In the interim it was discontinued and she was to start on Praluent.  She never filled the prescription.  She has been tolerating pravastatin well.  She notes that she has not been exercising much lately.  She has to sit a lot for work and does not get up to walk as much as she would like to.  She has been working on her diet and tries to limit carbs, though she is struggled with this some lately.  She has some mild lower extremity edema that is controlled with compression socks.  She also had a vein procedure in the past which helped.  Prior to that she had some stasis dermatitis.   Past Medical History:  Diagnosis Date  . Cancer [facility])    Colon Cancer adn polyps  . Essential hypertension 04/20/2019  . GERD (gastroesophageal reflux disease) 04/20/2019  .  History of hysterectomy    patrial hysterectomy  . Hyperlipidemia   . Hypothyroidism (acquired)    Multiple thyroid nodules  . Thyroid disease    Hypothyroidism  . Vertigo     Past Surgical History:  Procedure Laterality Date  . ABDOMINAL HYSTERECTOMY       Current Outpatient Medications  Medication Sig Dispense Refill  . acetaminophen (TYLENOL) 500 MG tablet 1 po q 4-6 hrs prn 60 tablet 0  . aspirin EC 81 MG tablet Take 81 mg by mouth daily. Swallow whole.    . hydrochlorothiazide (HYDRODIURIL) 25 MG tablet Take 1 tablet (25 mg total) by mouth daily. 90 tablet 3  . levothyroxine (SYNTHROID, LEVOTHROID) 88 MCG tablet Take 88 mcg by mouth daily before breakfast.    . pentoxifylline (TRENTAL) 400 MG CR tablet Take 400 mg by mouth 2 (two) times daily.    . pravastatin (PRAVACHOL) 20 MG tablet Take 20 mg by mouth daily.    . Vitamin D, Ergocalciferol, (DRISDOL) 50000 units CAPS capsule TAKE 1 CAPSULE ONCE WEEKLY FOR 8 WEEKS ORALLY 60 DAYS  0   Current Facility-Administered Medications  Medication Dose Route Frequency Provider Last Rate Last Admin  . methylPREDNISolone acetate (DEPO-MEDROL) injection 40 mg  40 mg Intra-articular Once Samantha [name], Michael, MD        Allergies:   Patient has no known allergies.  Social History:  The patient  reports that she has never smoked. She has never used smokeless tobacco. She reports that she does not drink alcohol and does not use drugs.   Family History:  The patient's family history includes Colon cancer in her maternal grandmother; Glaucoma in her mother; Hearing loss in her mother; Hyperlipidemia in her mother; Thyroid disease in her mother.    ROS:  Please see the history of present illness.   Otherwise, review of systems are positive for none.   All other systems are reviewed and negative.    PHYSICAL EXAM: VS:  BP (!) 116/64   Pulse 64   Ht 5\' 3"  (1.6 m)   Wt (!) 269 lb 6.4 oz (122.2 kg)   SpO2 95%   BMI 47.72 kg/m  , BMI Body  mass index is 47.72 kg/m. GENERAL:  Well appearing HEENT: Pupils equal round and reactive, fundi not visualized, oral mucosa unremarkable NECK:  No jugular venous distention, waveform within normal limits, carotid upstroke brisk and symmetric, no bruits LUNGS:  Clear to auscultation bilaterally HEART:  RRR.  PMI not displaced or sustained,S1 and S2 within normal limits, no S3, no S4, no clicks, no rubs, no murmurs ABD:  Flat, positive bowel sounds normal in frequency in pitch, no bruits, no rebound, no guarding, no midline pulsatile mass, no hepatomegaly, no splenomegaly EXT:  2 plus pulses throughout, no edema, no cyanosis no clubbing SKIN:  No rashes no nodules NEURO:  Cranial nerves II through XII grossly intact, motor grossly intact throughout PSYCH:  Cognitively intact, oriented to person place and time   EKG:  EKG is ordered today. The ekg ordered 04/02/19 demonstrates sinus rhythm.  Rate 67 bpm.  Nonspecific ST T changes.  10/13/2019: Sinus rhythm.  Rate 64 bpm.  PACs.   Recent Labs: 08/04/2019: ALT 20; BUN 12; Creatinine, Ser 0.96; Potassium 4.3; Sodium 138    Lipid Panel    Component Value Date/Time   CHOL 167 08/04/2019 0855   TRIG 95 08/04/2019 0855   HDL 58 08/04/2019 0855   CHOLHDL 2.9 08/04/2019 0855   LDLCALC 92 08/04/2019 0855      Wt Readings from Last 3 Encounters:  10/13/19 (!) 269 lb 6.4 oz (122.2 kg)  07/11/19 267 lb 3.2 oz (121.2 kg)  04/02/19 273 lb 6.4 oz (124 kg)      ASSESSMENT AND PLAN:  # Hypertension:  # LE edema: Blood pressure was well controlled today.  However at home it has been very labile and mostly running over 130/80.  Continue HCTZ.  Her blood pressure has been controlled and she is not taking valsartan.  Continue to limit sodium intake and increase exercise 250 minutes weekly.  # Morbid obesity: Ms. Samantha [name] was encouraged to increase her exercise and choose one dietary change.  Goal is >150 minutes of exercise weekly.  #  Hyperlipidemia:  Continue pravastatin.  LDL was previously over 190.  Most recently it has been in the 90s on pravastatin only.  Repeat lipids and a CMP in 6 months.  We may need to resume Praluent based on these results.  LDL goal is less than 100.  # Atypical chest pain: Resolved.  Symptoms were not exertional and seem to be attributable to GERD.   Current medicines are reviewed at length with the patient today.  The patient does not have concerns regarding medicines.  The following changes have been made:none  Labs/ tests ordered today include:   Orders Placed This Encounter  Procedures  . Lipid panel  . Comprehensive metabolic panel  . EKG 12-Lead     Disposition:   FU with Nael Petrosyan C. Oval Linsey, MD, [facility] in 6 months      Signed, Catori Panozzo C. Oval Linsey, MD, [facility]  10/13/2019 1:58 PM    [place]

## 2019-10-13 NOTE — Patient Instructions (Addendum)
Medication Instructions:  Your physician recommends that you continue on your current medications as directed. Please refer to the Current Medication list given to you today.  *If you need a refill on your cardiac medications before your next appointment, please call your pharmacy*  Lab Work: FASTING LP/CMET PRIOR TO 6 MONTH FOLLOW UP   Testing/Procedures: NONE   Follow-Up: At [facility], you and your health needs are our priority.  As part of our continuing mission to provide you with exceptional heart care, we have created designated Provider Care Teams.  These Care Teams include your primary Cardiologist (physician) and Advanced Practice Providers (APPs -  Physician Assistants and Nurse Practitioners) who all work together to provide you with the care you need, when you need it.  We recommend signing up for the patient portal called "MyChart".  Sign up information is provided on this After Visit Summary.  MyChart is used to connect with patients for Virtual Visits (Telemedicine).  Patients are able to view lab/test results, encounter notes, upcoming appointments, etc.  Non-urgent messages can be sent to your provider as well.   To learn more about what you can do with MyChart, go to [email or web address].    Your next appointment:   6 month(s) You will receive a reminder letter in the mail two months in advance. If you don't receive a letter, please call our office to schedule the follow-up appointment.  The format for your next appointment:   In Person  Provider:   You may see DR [facility] or one of the following Advanced Practice Providers on your designated Care Team:    Kerin Ransom, PA-C  [place], [place]  Coletta Memos, [place]

## 2019-12-29 ENCOUNTER — Other Ambulatory Visit: Payer: Self-pay | Admitting: Family Medicine

## 2019-12-29 DIAGNOSIS — Z1231 Encounter for screening mammogram for malignant neoplasm of breast: Secondary | ICD-10-CM

## 2019-12-31 ENCOUNTER — Other Ambulatory Visit: Payer: BC Managed Care – PPO

## 2019-12-31 DIAGNOSIS — Z20822 Contact with and (suspected) exposure to covid-19: Secondary | ICD-10-CM

## 2020-01-01 LAB — SARS-COV-2, NAA 2 DAY TAT

## 2020-01-01 LAB — NOVEL CORONAVIRUS, NAA: SARS-CoV-2, NAA: NOT DETECTED

## 2020-01-02 ENCOUNTER — Telehealth: Payer: Self-pay | Admitting: *Deleted

## 2020-01-02 NOTE — Telephone Encounter (Signed)
Pt is aware covid 19 test is neg on 01/02/2020 

## 2020-01-20 ENCOUNTER — Ambulatory Visit
Admission: RE | Admit: 2020-01-20 | Discharge: 2020-01-20 | Disposition: A | Discharge status: Home / Self Care | Payer: BC Managed Care – PPO | Source: Ambulatory Visit | Attending: Family Medicine | Admitting: Family Medicine

## 2020-01-20 ENCOUNTER — Other Ambulatory Visit: Payer: Self-pay

## 2020-01-20 DIAGNOSIS — Z1231 Encounter for screening mammogram for malignant neoplasm of breast: Secondary | ICD-10-CM

## 2020-03-15 ENCOUNTER — Encounter: Payer: Self-pay | Admitting: Cardiovascular Disease

## 2020-03-16 ENCOUNTER — Other Ambulatory Visit: Payer: BC Managed Care – PPO

## 2020-03-16 DIAGNOSIS — Z20822 Contact with and (suspected) exposure to covid-19: Secondary | ICD-10-CM

## 2020-03-17 LAB — NOVEL CORONAVIRUS, NAA: SARS-CoV-2, NAA: NOT DETECTED

## 2020-03-17 LAB — SARS-COV-2, NAA 2 DAY TAT

## 2020-04-09 ENCOUNTER — Encounter: Payer: Self-pay | Admitting: Cardiovascular Disease

## 2020-04-09 ENCOUNTER — Telehealth (INDEPENDENT_AMBULATORY_CARE_PROVIDER_SITE_OTHER): Payer: BC Managed Care – PPO | Admitting: Cardiovascular Disease

## 2020-04-09 VITALS — BP 121/79 | HR 75

## 2020-04-09 DIAGNOSIS — Z5181 Encounter for therapeutic drug level monitoring: Secondary | ICD-10-CM

## 2020-04-09 DIAGNOSIS — E7800 Pure hypercholesterolemia, unspecified: Secondary | ICD-10-CM

## 2020-04-09 NOTE — Progress Notes (Signed)
Virtual Visit via Video Note   This visit type was conducted due to national recommendations for restrictions regarding the COVID-19 Pandemic (e.g. social distancing) in an effort to limit this patient's exposure and mitigate transmission in our community.  Due to her co-morbid illnesses, this patient is at least at moderate risk for complications without adequate follow up.  This format is felt to be most appropriate for this patient at this time.  All issues noted in this document were discussed and addressed.  A limited physical exam was performed with this format.  Please refer to the patient's chart for her consent to telehealth for [facility].     The patient was identified using 2 identifiers.  Date:  04/09/2020   ID:  Samantha [name], DOB 1961/01/28, MRN [id number]  Patient Location: Home Provider Location: Office/Clinic  PCP:  Maurice Small, MD  Cardiologist:  Skeet Latch, MD  Electrophysiologist:  None   Evaluation Performed:  Follow-Up Visit  Chief Complaint:  hyperlipidemia  History of Present Illness:    The patient does not have symptoms concerning for COVID-19 infection (fever, chills, cough, or new shortness of breath).   Samantha [name] is a 60 y.o. female with GERD, hypertension, hyperlipidemia, and obesity who presents for follow up. She was initially seen in 2018 with atypical chest pain and shortness of breath.  She ws referred for an echo that was unremarkable.  She also had an ETT that showed poor exercise tolerance.  She was started on amlodipine.  At her last appointment this was stopped due to increased lower extremity edema.  She has venous insufficiency and had laser treatment 03/24/18.  She noted skin discoloration.  She wears compression socks and follows up with Dr. Jones Skene at [facility].   Samantha [name] was started on HCTZ due to poorly controlled BP. She was previously on other statins and had a lot of stiffness.  She  previously took Repatha and tolerated it well.  In the interim it was discontinued and she was to start on Praluent.  She never filled the prescription.  She has been tolerating pravastatin well.  Since her last appointment she has been doing well.  She is getting over an upper respiratory tract infection.  She was losing weight but gained lately.  Her BP has been stable in the 120s/70s.  She is tolerating pravastatin well.  She hasn't been exercising much in the last month.  She joined a walking challenge and wants to get back into it soon.  She feels well with exercise and has no chest pain or shortness of breath.  She wears compression socks which limits her swelling.  She has a follow up with the vein specialist due to throbbing in her legs.  She hasn't been getting enough sleep lately.  She has cataract surgery upcoming.   Past Medical History:  Diagnosis Date  . Cancer [facility])    Colon Cancer adn polyps  . Essential hypertension 04/20/2019  . GERD (gastroesophageal reflux disease) 04/20/2019  . History of hysterectomy    patrial hysterectomy  . Hyperlipidemia   . Hypothyroidism (acquired)    Multiple thyroid nodules  . Thyroid disease    Hypothyroidism  . Vertigo     Past Surgical History:  Procedure Laterality Date  . ABDOMINAL HYSTERECTOMY       Current Outpatient Medications  Medication Sig Dispense Refill  . acetaminophen (TYLENOL) 500 MG tablet 1 po q 4-6 hrs prn 60 tablet 0  .  Ascorbic Acid (VITAMIN C PO) Take by mouth.    Marland Kitchen aspirin EC 81 MG tablet Take 81 mg by mouth daily. Swallow whole.    Marland Kitchen CALCIUM PO Take by mouth.    . Cholecalciferol (VITAMIN D-3) 125 MCG (5000 UT) TABS Take by mouth.    . levothyroxine (SYNTHROID, LEVOTHROID) 88 MCG tablet Take 88 mcg by mouth daily before breakfast.    . pentoxifylline (TRENTAL) 400 MG CR tablet Take 400 mg by mouth in the morning and at bedtime.    . pravastatin (PRAVACHOL) 20 MG tablet Take 20 mg by mouth daily.    . TURMERIC PO  Take by mouth.    . hydrochlorothiazide (HYDRODIURIL) 25 MG tablet Take 1 tablet (25 mg total) by mouth daily. 90 tablet 3   Current Facility-Administered Medications  Medication Dose Route Frequency Provider Last Rate Last Admin  . methylPREDNISolone acetate (DEPO-MEDROL) injection 40 mg  40 mg Intra-articular Once Hilts, Michael, MD        Allergies:   Patient has no known allergies.    Social History:  The patient  reports that she has never smoked. She has never used smokeless tobacco. She reports that she does not drink alcohol and does not use drugs.   Family History:  The patient's family history includes Colon cancer in her maternal grandmother; Glaucoma in her mother; Hearing loss in her mother; Hyperlipidemia in her mother; Thyroid disease in her mother.    ROS:  Please see the history of present illness.   Otherwise, review of systems are positive for none.   All other systems are reviewed and negative.    PHYSICAL EXAM: BP 121/79   Pulse 75  GENERAL: Well-appearing.  No acute distress. HEENT: Pupils equal round.  Oral mucosa unremarkable NECK:  No jugular venous distention, no visible thyromegaly EXT:  No edema, no cyanosis no clubbing SKIN:  No rashes no nodules NEURO:  Speech fluent.  Cranial nerves grossly intact.  Moves all 4 extremities freely PSYCH:  Cognitively intact, oriented to person place and time  EKG:  EKG is not ordered today. The ekg ordered 04/02/19 demonstrates sinus rhythm.  Rate 67 bpm.  Nonspecific ST T changes.  10/13/2019: Sinus rhythm.  Rate 64 bpm.  PACs.   Recent Labs: 08/04/2019: ALT 20; BUN 12; Creatinine, Ser 0.96; Potassium 4.3; Sodium 138    Lipid Panel    Component Value Date/Time   CHOL 167 08/04/2019 0855   TRIG 95 08/04/2019 0855   HDL 58 08/04/2019 0855   CHOLHDL 2.9 08/04/2019 0855   LDLCALC 92 08/04/2019 0855      Wt Readings from Last 3 Encounters:  10/13/19 (!) 269 lb 6.4 oz (122.2 kg)  07/11/19 267 lb 3.2 oz (121.2  kg)  04/02/19 273 lb 6.4 oz (124 kg)      ASSESSMENT AND PLAN:  # Hypertension:  # LE edema: Blood pressure remains well-controlled.  Continue HCTZ.  She will come for a CMP.  Continue compression stockings and follow-up with renovascular.  # Morbid obesity: Samantha [name] was encouraged to increase her exercise and choose one dietary change.  Goal is >150 minutes of exercise weekly.  She is joined a walking club and thinks this will improve her accountability.  # Hyperlipidemia:  Continue pravastatin.  LDL was previously over 190.  Most recently it has been in the 90s on pravastatin only.  She will come for fasting lipids and a CMP.  We may need to resume Praluent based on  these results.  LDL goal is less than 100.  # Atypical chest pain: Resolved.  Symptoms were not exertional and seem to be attributable to GERD.   Current medicines are reviewed at length with the patient today.  The patient does not have concerns regarding medicines.  The following changes have been made:none  Labs/ tests ordered today include:   Orders Placed This Encounter  Procedures  . Lipid panel  . Comprehensive metabolic panel     Disposition:   FU with Samantha Homann C. Oval Linsey, MD, [facility] in 1 year      Signed, Samantha Stahly C. Oval Linsey, MD, [facility]  04/09/2020 11:04 AM    [place]

## 2020-04-09 NOTE — Patient Instructions (Signed)
Medication Instructions:  Your physician recommends that you continue on your current medications as directed. Please refer to the Current Medication list given to you today.  *If you need a refill on your cardiac medications before your next appointment, please call your pharmacy*   Lab Work: FASTING lab work - lipid panel, CMET  If you have labs (blood work) drawn today and your tests are completely normal, you will receive your results only by:  [place] (if you have MyChart) OR  A paper copy in the mail If you have any lab test that is abnormal or we need to change your treatment, we will call you to review the results.   Testing/Procedures: NONE   Follow-Up: At [facility], you and your health needs are our priority.  As part of our continuing mission to provide you with exceptional heart care, we have created designated Provider Care Teams.  These Care Teams include your primary Cardiologist (physician) and Advanced Practice Providers (APPs -  Physician Assistants and Nurse Practitioners) who all work together to provide you with the care you need, when you need it.  We recommend signing up for the patient portal called "MyChart".  Sign up information is provided on this After Visit Summary.  MyChart is used to connect with patients for Virtual Visits (Telemedicine).  Patients are able to view lab/test results, encounter notes, upcoming appointments, etc.  Non-urgent messages can be sent to your provider as well.   To learn more about what you can do with MyChart, go to [email or web address].    Your next appointment:   12 month(s)  The format for your next appointment:   In Person  Provider:   Dr. Oval Linsey  Other Instructions

## 2020-04-13 ENCOUNTER — Other Ambulatory Visit: Payer: Self-pay | Admitting: Cardiovascular Disease

## 2021-02-14 ENCOUNTER — Other Ambulatory Visit: Payer: Self-pay | Admitting: Family Medicine

## 2021-02-14 DIAGNOSIS — Z1231 Encounter for screening mammogram for malignant neoplasm of breast: Secondary | ICD-10-CM

## 2021-02-15 ENCOUNTER — Ambulatory Visit
Admission: RE | Admit: 2021-02-15 | Discharge: 2021-02-15 | Disposition: A | Discharge status: Home / Self Care | Payer: Self-pay | Source: Ambulatory Visit | Attending: Family Medicine | Admitting: Family Medicine

## 2021-02-15 DIAGNOSIS — Z1231 Encounter for screening mammogram for malignant neoplasm of breast: Secondary | ICD-10-CM

## 2021-03-19 ENCOUNTER — Other Ambulatory Visit: Payer: Self-pay | Admitting: Cardiovascular Disease

## 2021-05-09 ENCOUNTER — Other Ambulatory Visit: Payer: Self-pay | Admitting: Cardiovascular Disease

## 2021-05-09 NOTE — Telephone Encounter (Signed)
Rx(s) sent to pharmacy electronically.  

## 2021-06-07 ENCOUNTER — Other Ambulatory Visit: Payer: Self-pay | Admitting: Cardiovascular Disease

## 2021-06-07 MED ORDER — HYDROCHLOROTHIAZIDE 25 MG PO TABS: 25.0000 mg | ORAL_TABLET | Freq: Every day | ORAL | 0 refills | Status: DC

## 2021-06-16 ENCOUNTER — Ambulatory Visit (INDEPENDENT_AMBULATORY_CARE_PROVIDER_SITE_OTHER): Payer: BC Managed Care – PPO

## 2021-06-16 ENCOUNTER — Ambulatory Visit: Payer: BC Managed Care – PPO | Admitting: Orthopaedic Surgery

## 2021-06-16 ENCOUNTER — Encounter: Payer: Self-pay | Admitting: Orthopaedic Surgery

## 2021-06-16 ENCOUNTER — Ambulatory Visit: Payer: Self-pay

## 2021-06-16 VITALS — Ht 63.0 in | Wt 265.0 lb

## 2021-06-16 DIAGNOSIS — M170 Bilateral primary osteoarthritis of knee: Secondary | ICD-10-CM

## 2021-06-16 DIAGNOSIS — M25561 Pain in right knee: Secondary | ICD-10-CM | POA: Diagnosis not present

## 2021-06-16 DIAGNOSIS — M25562 Pain in left knee: Secondary | ICD-10-CM

## 2021-06-16 DIAGNOSIS — Z6841 Body Mass Index (BMI) 40.0 and over, adult: Secondary | ICD-10-CM | POA: Insufficient documentation

## 2021-06-16 DIAGNOSIS — G8929 Other chronic pain: Secondary | ICD-10-CM

## 2021-06-16 DIAGNOSIS — M1711 Unilateral primary osteoarthritis, right knee: Secondary | ICD-10-CM

## 2021-06-16 DIAGNOSIS — M1712 Unilateral primary osteoarthritis, left knee: Secondary | ICD-10-CM

## 2021-06-16 MED ORDER — LIDOCAINE HCL 1 % IJ SOLN
3.0000 mL | INTRAMUSCULAR | Status: AC | PRN
  Administered 2021-06-16: 3 mL

## 2021-06-16 MED ORDER — METHYLPREDNISOLONE ACETATE 40 MG/ML IJ SUSP
13.3300 mg | INTRAMUSCULAR | Status: AC | PRN
  Administered 2021-06-16: 13.33 mg via INTRA_ARTICULAR

## 2021-06-16 MED ORDER — BUPIVACAINE HCL 0.25 % IJ SOLN
0.6600 mL | INTRAMUSCULAR | Status: AC | PRN
  Administered 2021-06-16: .66 mL via INTRA_ARTICULAR

## 2021-06-16 MED ORDER — TRAMADOL HCL 50 MG PO TABS: 50.0000 mg | ORAL_TABLET | Freq: Two times a day (BID) | ORAL | 2 refills | Status: DC | PRN

## 2021-06-16 NOTE — Progress Notes (Signed)
? ?Office Visit Note ?  ?Patient: Samantha [name]           ?Date of Birth: 10/02/1960           ?MRN: [id number] ?Visit Date: 06/16/2021 ?             ?Requested by: Maurice Small, MD ?[address] ?[address] ?[place],  [place] [address] ?PCP: Maurice Small, MD ? ? ?Assessment & Plan: ?Visit Diagnoses:  ?1. Chronic pain of both knees   ?2. Bilateral primary osteoarthritis of knee   ? ? ?Plan: Impression is bilateral knee degenerative joint disease left greater than right.  Today, we discussed cortisone versus viscosupplementation versus total knee arthroplasty.  She is currently interested in cortisone injections.  If she does not have great relief she will let us know we will get approval for disco injection.  Call with concerns or questions in the meantime. ? ?The patient meets the AMA guidelines for Morbid (severe) obesity with a BMI > 40.0 and I have recommended weight loss. ? ?Follow-Up Instructions: Return if symptoms worsen or fail to improve.  ? ?Orders:  ?Orders Placed This Encounter  ?Procedures  ? XR KNEE 3 VIEW LEFT  ? XR KNEE 3 VIEW RIGHT  ? ?Meds ordered this encounter  ?Medications  ? traMADol (ULTRAM) 50 MG tablet  ?  Sig: Take 1 tablet (50 mg total) by mouth every 12 (twelve) hours as needed.  ?  Dispense:  30 tablet  ?  Refill:  2  ? ? ? ? Procedures: ?Large Joint Inj: bilateral knee on 06/16/2021 10:46 AM ?Indications: pain ?Details: 22 G needle, anterolateral approach ?Medications (Right): 0.66 mL bupivacaine 0.25 %; 3 mL lidocaine 1 %; 13.33 mg methylPREDNISolone acetate 40 MG/ML ?Medications (Left): 0.66 mL bupivacaine 0.25 %; 3 mL lidocaine 1 %; 13.33 mg methylPREDNISolone acetate 40 MG/ML ? ? ? ? ?Clinical Data: ?No additional findings. ? ? ?Subjective: ?Chief Complaint  ?Patient presents with  ? Left Knee - Pain  ? Right Knee - Pain  ? ? ?HPI patient is a pleasant 61 year old female who comes in today with bilateral knee pain left greater than right.  She has had intermittent pain for  years but has progressively worsened over the past month and a half.  No new injury or change in activity.  The pain is primarily to the medial aspect.  Pain is worse with stair climbing as well as going from a seated to standing position.  She denies any pain at night.  She takes an occasional Tylenol with mild relief.  She underwent bilateral knee cortisone injections several years ago in her old office which did help for a long period of time. ? ?Review of Systems as detailed in HPI.  All others reviewed and are negative. ? ? ?Objective: ?Vital Signs: Ht '5\' 3"'$  (1.6 m)   Wt 265 lb (120.2 kg)   BMI 46.94 kg/m?  ? ?Physical Exam well-developed well-nourished female no acute distress.  Alert and oriented x3. ? ?Ortho Exam bilateral knee exam show range of motion from 0 to 100 degrees.  Medial joint line tenderness on the left.  Moderate bilateral patellofemoral crepitus.  Ligaments are stable.  She is neurovascular intact distally. ? ?Specialty Comments:  ?No specialty comments available. ? ?Imaging: ?No results found. ? ? ?PMFS History: ?Patient Active Problem List  ? Diagnosis Date Noted  ? GERD (gastroesophageal reflux disease) 04/20/2019  ? Essential hypertension 04/20/2019  ? Bilateral hip pain 05/09/2018  ?  Body mass index 40.0-44.9, adult ([place]) 05/09/2018  ? Morbid obesity ([place]) 05/09/2018  ? Chronic pain of both knees 08/17/2017  ? ?Past Medical History:  ?Diagnosis Date  ? Cancer [facility])   ? Colon Cancer adn polyps  ? Essential hypertension 04/20/2019  ? GERD (gastroesophageal reflux disease) 04/20/2019  ? History of hysterectomy   ? patrial hysterectomy  ? Hyperlipidemia   ? Hypothyroidism (acquired)   ? Multiple thyroid nodules  ? Thyroid disease   ? Hypothyroidism  ? Vertigo   ?  ?Family History  ?Problem Relation Age of Onset  ? Thyroid disease Mother   ? Hearing loss Mother   ? Glaucoma Mother   ? Hyperlipidemia Mother   ? Colon cancer Maternal Grandmother   ?  ?Past Surgical History:  ?Procedure  Laterality Date  ? ABDOMINAL HYSTERECTOMY    ? ?Social History  ? ?Occupational History  ? Not on file  ?Tobacco Use  ? Smoking status: Never  ? Smokeless tobacco: Never  ?Substance and Sexual Activity  ? Alcohol use: No  ? Drug use: No  ? Sexual activity: Not on file  ? ? ? ? ? ? ?

## 2021-07-06 ENCOUNTER — Other Ambulatory Visit: Payer: Self-pay | Admitting: Family Medicine

## 2021-07-06 DIAGNOSIS — E039 Hypothyroidism, unspecified: Secondary | ICD-10-CM

## 2021-07-06 DIAGNOSIS — E042 Nontoxic multinodular goiter: Secondary | ICD-10-CM

## 2021-08-11 ENCOUNTER — Encounter (HOSPITAL_BASED_OUTPATIENT_CLINIC_OR_DEPARTMENT_OTHER): Payer: Self-pay | Admitting: Cardiovascular Disease

## 2021-08-11 ENCOUNTER — Encounter (HOSPITAL_BASED_OUTPATIENT_CLINIC_OR_DEPARTMENT_OTHER): Payer: Self-pay | Admitting: *Deleted

## 2021-08-11 ENCOUNTER — Ambulatory Visit (INDEPENDENT_AMBULATORY_CARE_PROVIDER_SITE_OTHER): Payer: BC Managed Care – PPO | Admitting: Cardiovascular Disease

## 2021-08-11 VITALS — BP 126/82 | HR 65 | Ht 63.0 in | Wt 268.1 lb

## 2021-08-11 DIAGNOSIS — E7800 Pure hypercholesterolemia, unspecified: Secondary | ICD-10-CM | POA: Diagnosis not present

## 2021-08-11 DIAGNOSIS — R0681 Apnea, not elsewhere classified: Secondary | ICD-10-CM | POA: Diagnosis not present

## 2021-08-11 DIAGNOSIS — I10 Essential (primary) hypertension: Secondary | ICD-10-CM | POA: Diagnosis not present

## 2021-08-11 DIAGNOSIS — Z6841 Body Mass Index (BMI) 40.0 and over, adult: Secondary | ICD-10-CM

## 2021-08-11 DIAGNOSIS — R0683 Snoring: Secondary | ICD-10-CM

## 2021-08-11 HISTORY — DX: Pure hypercholesterolemia, unspecified: E78.00

## 2021-08-11 HISTORY — DX: Snoring: R06.83

## 2021-08-11 NOTE — Assessment & Plan Note (Signed)
She reports heavy snoring, apnea and daytime somnolence.  Will get a sleep study to evaluate for OSA.

## 2021-08-11 NOTE — Patient Instructions (Addendum)
Medication Instructions:  Your physician recommends that you continue on your current medications as directed. Please refer to the Current Medication list given to you today.   *If you need a refill on your cardiac medications before your next appointment, please call your pharmacy*  Lab Work: FASTING LP/CMET 1 [place] UP   If you have labs (blood work) drawn today and your tests are completely normal, you will receive your results only by: [place] (if you have MyChart) OR A paper copy in the mail If you have any lab test that is abnormal or we need to change your treatment, we will call you to review the results.  Testing/Procedures: Your physician has recommended that you have a sleep study. This test records several body functions during sleep, including: brain activity, eye movement, oxygen and carbon dioxide blood levels, heart rate and rhythm, breathing rate and rhythm, the flow of air through your mouth and nose, snoring, body muscle movements, and chest and belly movement.  Follow-Up: At [facility], you and your health needs are our priority.  As part of our continuing mission to provide you with exceptional heart care, we have created designated Provider Care Teams.  These Care Teams include your primary Cardiologist (physician) and Advanced Practice Providers (APPs -  Physician Assistants and Nurse Practitioners) who all work together to provide you with the care you need, when you need it.  We recommend signing up for the patient portal called "MyChart".  Sign up information is provided on this After Visit Summary.  MyChart is used to connect with patients for Virtual Visits (Telemedicine).  Patients are able to view lab/test results, encounter notes, upcoming appointments, etc.  Non-urgent messages can be sent to your provider as well.   To learn more about what you can do with MyChart, go to [email or web address].    Your next appointment:   3-4  month(s)  The format for your next appointment:   In Person  Provider:   Skeet Latch, MD   You have been referred to  Where: [place] Address: [address] [address] Phone: [phone]  IF YOU DO NOT HEAR FROM THEM WITHIN 2 WEEKS YOU CAN CALL THEM DIRECTLY AT NUMBER LISTED   Other Instructions LORA OR PAM WILL BE IN TOUCH REGARDING PREP ([facility]) PROGRAM   MONITOR AND LOG YOUR BLOOD PRESSURE. BRING READINGS AND MACHINE TO FOLLOW UP

## 2021-08-11 NOTE — Assessment & Plan Note (Signed)
She was congratulated on her commitment to exercise.  She has been walking daily.  I encouraged her to try to walk a little bit further during her 1 hour walk each day.  Referral to healthy weight and wellness and PREP as above.

## 2021-08-11 NOTE — Assessment & Plan Note (Signed)
BP nearly at goal today.  At home it has been ranging in the 120s to 140s.  She is going to really work on lifestyle modification before we make any additional changes to her medicines.  We will refer her to healthy weight and wellness and PREP.  Continue hydrochlorothiazide for now.

## 2021-08-11 NOTE — Assessment & Plan Note (Signed)
She has been on pravastatin chronically.  Lipids are okay but certainly could be better.  Before intensifying her statin she is going to focus on lifestyle modification as above.  Repeat lipids and a CMP the week before her follow-up appointment in a few months.

## 2021-08-11 NOTE — Progress Notes (Signed)
Cardiology Office Note:    Date:  08/11/2021   ID:  Samantha, [name] [birth year], MRN [id number]  PCP:  Maurice Small, MD   [facility] Providers Cardiologist:  Skeet Latch, MD     Referring MD: Maurice Small, MD   CC:  Hyperlipidemia  History of Present Illness:    Samantha [name] is a 61 y.o. female with a hx of GERD, hypertension, hyperlipidemia, and obesity who presents for follow up. She was initially seen in 2018 with atypical chest pain and shortness of breath.  She ws referred for an echo that was unremarkable.  She also had an ETT that showed poor exercise tolerance.  She was started on amlodipine. However, this was stopped due to increased lower extremity edema.  She has venous insufficiency and had laser treatment 03/24/18.  She noted skin discoloration.  She wears compression socks and follows up with Dr. Jones Skene at [facility]. [name] was started on HCTZ due to poorly controlled BP. She was previously on other statins and had a lot of stiffness.  She previously took Repatha and tolerated it well.  In the interim it was discontinued and she was to start on Praluent.  She never filled the prescription.  She has been tolerating pravastatin well.  At her last appointment she was recovering from an URI but otherwise doing well. Her BP was stable in the 120s/70s, and she wanted to work on increasing her walking exercise. She was also encouraged to work on dietary changes. She was scheduled to follow up with the vein specialist due to throbbing in her legs. Today, she denies any recurring palpitations or chest pain. Her breathing has been good. Her at home blood pressures have been ranging 818-299 systolic, but mostly from 120-130. In clinic today her BP is 126/82, which she notes has been similar at other recent clinic visits. She also notes she did not drink coffee today. She believes drinking coffee may correlate with her higher blood pressures. Her  weight has been fluctuating but she continues to work on weight loss. She is walking every day for exercise for an hour, almost 2 miles. If she sits too long, especially while working, she notices some LE edema mostly localized to her knees. She will try to get up and walk around every few hours with alerts from her watch. Lately she has ordered out more often than preparing meals at home. She plans to return to her meal prep routines. Recently she participated in a nutritionist webinar program which provided various techniques to try. She endorses snoring and daytime somnolence. Of note, she has been told by her dentist that her airways seem to be restricted. She is concerned that she may have sleep apnea. She denies any lightheadedness, headaches, syncope, orthopnea, or PND.   Past Medical History:  Diagnosis Date   Cancer [facility])    Colon Cancer adn polyps   Essential hypertension 04/20/2019   GERD (gastroesophageal reflux disease) 04/20/2019   History of hysterectomy    patrial hysterectomy   Hyperlipidemia    Hypothyroidism (acquired)    Multiple thyroid nodules   Pure hypercholesterolemia 08/11/2021   Snoring 08/11/2021   Thyroid disease    Hypothyroidism   Vertigo     Past Surgical History:  Procedure Laterality Date   ABDOMINAL HYSTERECTOMY      Current Medications: Current Meds  Medication Sig   acetaminophen (TYLENOL) 500 MG tablet 1 po q 4-6 hrs prn   Ascorbic Acid (  VITAMIN C PO) Take by mouth.   aspirin EC 81 MG tablet Take 81 mg by mouth daily. Swallow whole.   CALCIUM PO Take by mouth.   Cholecalciferol (VITAMIN D-3) 125 MCG (5000 UT) TABS Take by mouth.   hydrochlorothiazide (HYDRODIURIL) 25 MG tablet Take 1 tablet (25 mg total) by mouth daily. Please keep upcoming appt for future refills   levothyroxine (SYNTHROID, LEVOTHROID) 88 MCG tablet Take 88 mcg by mouth daily before breakfast.   pentoxifylline (TRENTAL) 400 MG CR tablet Take 400 mg by mouth in the morning and at  bedtime.   pravastatin (PRAVACHOL) 20 MG tablet Take 20 mg by mouth daily.   traMADol (ULTRAM) 50 MG tablet Take 1 tablet (50 mg total) by mouth every 12 (twelve) hours as needed.   TURMERIC PO Take by mouth.   vitamin E 180 MG (400 UNITS) capsule Take 400 Units by mouth daily.   Current Facility-Administered Medications for the 08/11/21 encounter (Office Visit) with Skeet Latch, MD  Medication   methylPREDNISolone acetate (DEPO-MEDROL) injection 40 mg     Allergies:   Patient has no known allergies.   Social History   Socioeconomic History   Marital status: Single    Spouse name: Not on file   Number of children: Not on file   Years of education: Not on file   Highest education level: Not on file  Occupational History   Not on file  Tobacco Use   Smoking status: Never   Smokeless tobacco: Never  Substance and Sexual Activity   Alcohol use: No   Drug use: No   Sexual activity: Not on file  Other Topics Concern   Not on file  Social History Narrative   ** Merged History Encounter **       Social Determinants of Health   Financial Resource Strain: Not on file  Food Insecurity: Not on file  Transportation Needs: Not on file  Physical Activity: Not on file  Stress: Not on file  Social Connections: Not on file     Family History: The patient's family history includes Colon cancer in her maternal grandmother; Glaucoma in her mother; Hearing loss in her mother; Hyperlipidemia in her mother; Thyroid disease in her mother.  ROS:   Please see the history of present illness.    (+) Bilateral LE edema (+) Snoring (+) Daytime somnolence All other systems reviewed and are negative.  EKGs/Labs/Other Studies Reviewed:    The following studies were reviewed today:  03/16/2017  Exercise Tolerance Test: Blood pressure demonstrated a hypertensive response to exercise. There was no ST segment deviation noted during stress.   Poor exercise capacity. Hypertensive  response to exertion (210/75 mmHg). No ischemia.   06/26/2016  Echocardiogram: Study Conclusions   - Left ventricle: The cavity size was normal. Systolic function was    normal. The estimated ejection fraction was in the range of 55%    to 60%. Wall motion was normal; there were no regional wall    motion abnormalities. Left ventricular diastolic function    parameters were normal.    EKG:   EKG is personally reviewed. 08/11/2021: Sinus rhythm.  Rate 65 bpm.  PACs. 10/13/2019: Sinus rhythm.  Rate 64 bpm.  PACs. 04/02/2019: Sinus rhythm.  Rate 67 bpm.  Nonspecific ST T changes.  Recent Labs: No results found for requested labs within last 8760 hours.   Recent Lipid Panel    Component Value Date/Time   CHOL 167 08/04/2019 0855   TRIG 95  08/04/2019 0855   HDL 58 08/04/2019 0855   CHOLHDL 2.9 08/04/2019 0855   LDLCALC 92 08/04/2019 0855    Physical Exam:    Wt Readings from Last 3 Encounters:  08/11/21 268 lb 1.6 oz (121.6 kg)  06/16/21 265 lb (120.2 kg)  10/13/19 (!) 269 lb 6.4 oz (122.2 kg)     VS:  BP 126/82 (BP Location: Left Arm, Patient Position: Sitting, Cuff Size: Large)   Pulse 65   Ht '5\' 3"'$  (1.6 m)   Wt 268 lb 1.6 oz (121.6 kg)   BMI 47.49 kg/m  , BMI Body mass index is 47.49 kg/m. GENERAL:  Well appearing HEENT: Pupils equal round and reactive, fundi not visualized, oral mucosa unremarkable NECK:  No jugular venous distention, waveform within normal limits, carotid upstroke brisk and symmetric, no bruits, no thyromegaly LUNGS:  Clear to auscultation bilaterally HEART:  RRR.  PMI not displaced or sustained,S1 and S2 within normal limits, no S3, no S4, no clicks, no rubs, no murmurs ABD:  Flat, positive bowel sounds normal in frequency in pitch, no bruits, no rebound, no guarding, no midline pulsatile mass, no hepatomegaly, no splenomegaly EXT:  2 plus pulses throughout, no edema, no cyanosis no clubbing SKIN:  No rashes no nodules NEURO:  Cranial nerves II  through XII grossly intact, motor grossly intact throughout PSYCH:  Cognitively intact, oriented to person place and time   ASSESSMENT:    1. Apnea   2. Essential hypertension   3. Pure hypercholesterolemia   4. Body mass index 40.0-44.9, adult ([place])   5. Snoring    PLAN:    Essential hypertension BP nearly at goal today.  At home it has been ranging in the 120s to 140s.  She is going to really work on lifestyle modification before we make any additional changes to her medicines.  We will refer her to healthy weight and wellness and PREP.  Continue hydrochlorothiazide for now.  Pure hypercholesterolemia She has been on pravastatin chronically.  Lipids are okay but certainly could be better.  Before intensifying her statin she is going to focus on lifestyle modification as above.  Repeat lipids and a CMP the week before her follow-up appointment in a few months.  Body mass index 40.0-44.9, adult [facility]) She was congratulated on her commitment to exercise.  She has been walking daily.  I encouraged her to try to walk a little bit further during her 1 hour walk each day.  Referral to healthy weight and wellness and PREP as above.  Snoring She reports heavy snoring, apnea and daytime somnolence.  Will get a sleep study to evaluate for OSA.   Disposition: FU with Haygen Zebrowski C. Oval Linsey, MD, [facility] in 3-4 months.  Medication Adjustments/Labs and Tests Ordered: Current medicines are reviewed at length with the patient today.  Concerns regarding medicines are outlined above.   Orders Placed This Encounter  Procedures   Lipid panel   Comprehensive metabolic panel   Amb Referral To Provider Referral Exercise Program (P.R.E.P)   Ambulatory referral to [facility]   EKG 12-Lead   Split night study   No orders of the defined types were placed in this encounter.  Patient Instructions  Medication Instructions:  Your physician recommends that you continue on your current medications as  directed. Please refer to the Current Medication list given to you today.   *If you need a refill on your cardiac medications before your next appointment, please call your pharmacy*  Lab Work: FASTING  LP/CMET 1 WEEK PRIOR TO YOUR FOLLOW UP   If you have labs (blood work) drawn today and your tests are completely normal, you will receive your results only by: MyChart Message (if you have MyChart) OR A paper copy in the mail If you have any lab test that is abnormal or we need to change your treatment, we will call you to review the results.  Testing/Procedures: Your physician has recommended that you have a sleep study. This test records several body functions during sleep, including: brain activity, eye movement, oxygen and carbon dioxide blood levels, heart rate and rhythm, breathing rate and rhythm, the flow of air through your mouth and nose, snoring, body muscle movements, and chest and belly movement.  Follow-Up: At [facility], you and your health needs are our priority.  As part of our continuing mission to provide you with exceptional heart care, we have created designated Provider Care Teams.  These Care Teams include your primary Cardiologist (physician) and Advanced Practice Providers (APPs -  Physician Assistants and Nurse Practitioners) who all work together to provide you with the care you need, when you need it.  We recommend signing up for the patient portal called "MyChart".  Sign up information is provided on this After Visit Summary.  MyChart is used to connect with patients for Virtual Visits (Telemedicine).  Patients are able to view lab/test results, encounter notes, upcoming appointments, etc.  Non-urgent messages can be sent to your provider as well.   To learn more about what you can do with MyChart, go to [email or web address].    Your next appointment:   3-4 month(s)  The format for your next appointment:   In Person  Provider:   Skeet Latch, MD    You have been referred to  Where: [place] Address: [place] [address] Phone: [phone]  IF YOU DO NOT HEAR FROM THEM WITHIN 2 WEEKS YOU CAN CALL THEM DIRECTLY AT NUMBER LISTED   Other Instructions LORA OR PAM WILL BE IN TOUCH REGARDING PREP ([facility]) PROGRAM   MONITOR AND LOG YOUR BLOOD PRESSURE. BRING READINGS AND MACHINE TO FOLLOW UP          I,Mathew Stumpf,acting as a scribe for Skeet Latch, MD.,have documented all relevant documentation on the behalf of Skeet Latch, MD,as directed by  Skeet Latch, MD while in the presence of Skeet Latch, MD.  I, [place] Oval Linsey, MD have reviewed all documentation for this visit.  The documentation of the exam, diagnosis, procedures, and orders on 08/11/2021 are all accurate and complete.   Signed, Skeet Latch, MD  08/11/2021 12:02 PM    Kevin

## 2021-08-12 ENCOUNTER — Telehealth: Payer: Self-pay | Admitting: *Deleted

## 2021-08-12 NOTE — Telephone Encounter (Signed)
Prior Authorization for Split night sleep study sent to [facility] via web portal. Per web portal no PA is required.Marland Kitchen

## 2021-08-15 ENCOUNTER — Telehealth: Payer: Self-pay

## 2021-08-15 NOTE — Telephone Encounter (Signed)
Called to discuss PREP program; she works from home, but is going through new training program at work; will like to start in July; will conact her in June with  next class schedule at [place] in July.

## 2021-09-21 ENCOUNTER — Telehealth: Payer: Self-pay

## 2021-09-21 NOTE — Telephone Encounter (Signed)
Called to confirm participation in [place] program on July 17 at [facility]; assessment visit scheduled for July 10 at 10am

## 2021-09-26 NOTE — Progress Notes (Signed)
[facility] PREP Evaluation  Patient Details  Name: Samantha [name] MRN: [id number] Date of Birth: 19-Feb-1961 Age: 61 y.o. PCP: Maurice Small, MD  Vitals:   09/26/21 1049  BP: (!) 136/94  Pulse: 67  SpO2: 97%  Weight: 271 lb 12.8 oz (123.3 kg)     [facility] Eval - 09/26/21 1000       [facility] "PREP" Location   [facility] "PREP" Location [place]      Referral    Referring Provider Oval Linsey    Reason for referral Hypertension;High Cholesterol;Obesitity/Overweight    Program Start Date 10/03/21      Measurement   Waist Circumference 49.75 inches    Hip Circumference 55 inches    Body fat --   E4     Information for Trainer   Goals --   Establish exercie routine, lose 10-15 pounds by end of program; lea n portion control   Current Exercise walking    Orthopedic Concerns OA knees    Pertinent Medical History --   HTN, high cholesterol   Current Barriers none      Timed Up and Go (TUGS)   Timed Up and Go Low risk <9 seconds      Mobility and Daily Activities   I find it easy to walk up or down two or more flights of stairs. 3    I have no trouble taking out the trash. 4    I do housework such as vacuuming and dusting on my own without difficulty. 4    I can easily lift a gallon of milk (8lbs). 4    I can easily walk a mile. 3    I have no trouble reaching into high cupboards or reaching down to pick up something from the floor. 4    I do not have trouble doing out-door work such as Armed forces logistics/support/administrative officer, raking leaves, or gardening. 3      Mobility and Daily Activities   I feel younger than my age. 2    I feel independent. 4    I feel energetic. 2    I live an active life.  2    I feel strong. 3    I feel healthy. 3    I feel active as other people my age. 3      How fit and strong are you.   Fit and Strong Total Score 44            Past Medical History:  Diagnosis Date   Cancer ([place])    Colon Cancer adn polyps   Essential hypertension 04/20/2019   GERD  (gastroesophageal reflux disease) 04/20/2019   History of hysterectomy    patrial hysterectomy   Hyperlipidemia    Hypothyroidism (acquired)    Multiple thyroid nodules   Pure hypercholesterolemia 08/11/2021   Snoring 08/11/2021   Thyroid disease    Hypothyroidism   Vertigo    Past Surgical History:  Procedure Laterality Date   ABDOMINAL HYSTERECTOMY     Social History   Tobacco Use  Smoking Status Never  Smokeless Tobacco Never  To begin PREP class at [facility] on July 17, every M/W 2-3:15 p  Axxel Gude B Shyheem Whitham 09/26/2021, 10:54 AM

## 2021-09-27 ENCOUNTER — Encounter (HOSPITAL_BASED_OUTPATIENT_CLINIC_OR_DEPARTMENT_OTHER): Payer: Self-pay | Admitting: Cardiovascular Disease

## 2021-09-27 NOTE — Telephone Encounter (Signed)
Please advise ok to write letter?

## 2021-10-03 NOTE — Progress Notes (Signed)
[facility] PREP Weekly Session  Patient Details  Name: Samantha [name] MRN: [id number] Date of Birth: 1960-12-10 Age: 61 y.o. PCP: Maurice Small, MD  There were no vitals filed for this visit.   [facility] Weekly seesion - 10/03/21 1500       [facility] "PREP" Location   [facility] "PREP" Location [name]      Weekly Session   Topic Discussed Goal setting and welcome to the program   Introductions, review of work book, tour of facility; offered short cardio session work out; sitting no more than 30 minutes            Ladavion Savitz B Jermari Tamargo 10/03/2021, 3:30 PM

## 2021-10-05 ENCOUNTER — Encounter (HOSPITAL_BASED_OUTPATIENT_CLINIC_OR_DEPARTMENT_OTHER): Payer: BC Managed Care – PPO | Admitting: Cardiovascular Disease

## 2021-10-06 ENCOUNTER — Encounter (HOSPITAL_BASED_OUTPATIENT_CLINIC_OR_DEPARTMENT_OTHER): Payer: Self-pay | Admitting: *Deleted

## 2021-10-06 NOTE — Telephone Encounter (Signed)
Discussed with Dr Oval Linsey and ok to do letter

## 2021-10-10 ENCOUNTER — Encounter: Payer: Self-pay | Admitting: *Deleted

## 2021-10-10 NOTE — Progress Notes (Signed)
[facility] PREP Weekly Session  Patient Details  Name: Samantha [name] MRN: [id number] Date of Birth: 04/23/1960 Age: 61 y.o. PCP: Maurice Small, MD  Vitals:   10/10/21 1535  Weight: 274 lb (124.3 kg)     [facility] Weekly seesion - 10/10/21 1500       [facility] "PREP" Location   [facility] "PREP" Location [name]      Weekly Session   Topic Discussed Other ways to be active;Importance of resistance training   Working towards: 150 mins of cardio/week, strength training 2-3x's/week for 20-40 mins   Classes attended to date 98    Comments 2             [name] 10/10/2021, 3:36 PM

## 2021-10-14 ENCOUNTER — Ambulatory Visit (HOSPITAL_BASED_OUTPATIENT_CLINIC_OR_DEPARTMENT_OTHER): Payer: BC Managed Care – PPO | Attending: Cardiovascular Disease | Admitting: Cardiovascular Disease

## 2021-10-14 DIAGNOSIS — R0683 Snoring: Secondary | ICD-10-CM | POA: Insufficient documentation

## 2021-10-14 DIAGNOSIS — R0681 Apnea, not elsewhere classified: Secondary | ICD-10-CM | POA: Diagnosis not present

## 2021-10-14 DIAGNOSIS — I10 Essential (primary) hypertension: Secondary | ICD-10-CM | POA: Insufficient documentation

## 2021-10-14 DIAGNOSIS — Z6841 Body Mass Index (BMI) 40.0 and over, adult: Secondary | ICD-10-CM | POA: Insufficient documentation

## 2021-10-14 DIAGNOSIS — G4733 Obstructive sleep apnea (adult) (pediatric): Secondary | ICD-10-CM | POA: Diagnosis present

## 2021-10-17 ENCOUNTER — Encounter (HOSPITAL_BASED_OUTPATIENT_CLINIC_OR_DEPARTMENT_OTHER): Payer: Self-pay | Admitting: Cardiovascular Disease

## 2021-10-17 NOTE — Procedures (Signed)
Patient Name: Samantha [name], Touch Date: 10/14/2021 Gender: Female D.O.B: 1960/12/04 Age (years): 61 Referring Provider: Skeet Latch Height (inches): 63 Interpreting Physician: Shelva Majestic MD, ABSM Weight (lbs): 172 RPSGT: Jorge Ny BMI: 30 MRN: [id number] Neck Size: 15.50 <br> <br> CLINICAL INFORMATION Sleep Study Type: Split Night CPAP  Indication for sleep study: Hypertension, Obesity, Snoring  Epworth Sleepiness Score: 9  SLEEP STUDY TECHNIQUE As per the AASM Manual for the Scoring of Sleep and Associated Events v2.3 (April 2016) with a hypopnea requiring 4% desaturations.  The channels recorded and monitored were frontal, central and occipital EEG, electrooculogram (EOG), submentalis EMG (chin), nasal and oral airflow, thoracic and abdominal wall motion, anterior tibialis EMG, snore microphone, electrocardiogram, and pulse oximetry. Continuous positive airway pressure (CPAP) was initiated when the patient met split night criteria and was titrated according to treat sleep-disordered breathing.  MEDICATIONS acetaminophen (TYLENOL) 500 MG tablet Ascorbic Acid (VITAMIN C PO) aspirin EC 81 MG tablet CALCIUM PO Cholecalciferol (VITAMIN D-3) 125 MCG (5000 UT) TABS hydrochlorothiazide (HYDRODIURIL) 25 MG tablet levothyroxine (SYNTHROID, LEVOTHROID) 88 MCG tablet pentoxifylline (TRENTAL) 400 MG CR tablet pravastatin (PRAVACHOL) 20 MG tablet traMADol (ULTRAM) 50 MG tablet TURMERIC PO vitamin E 180 MG (400 UNITS) capsule Medications self-administered by patient taken the night of the study : N/A  RESPIRATORY PARAMETERS Diagnostic Total AHI (/hr): 13.3 RDI (/hr): 14.5 OA Index (/hr): 0.4 CA Index (/hr): 0.0 REM AHI (/hr): 67.9 NREM AHI (/hr): 1.5 Supine AHI (/hr): 0.0 Non-supine AHI (/hr): 15.7 Min O2 Sat (%): 69.0 Mean O2 (%): 92.4 Time below 88% (min): 11.9   Titration Optimal Pressure (cm):  AHI at Optimal Pressure (/hr): N/A Min O2 at Optimal  Pressure (%): 80.0 Supine % at Optimal (%): N/A Sleep % at Optimal (%): N/A   SLEEP ARCHITECTURE The recording time for the entire night was 428.3 minutes.  During a baseline period of 188.3 minutes, the patient slept for 148.9 minutes in REM and nonREM, yielding a sleep efficiency of 79.1%%. Sleep onset after lights out was 15.5 minutes with a REM latency of 109.5 minutes. The patient spent 5.4%% of the night in stage N1 sleep, 76.8%% in stage N2 sleep, 0.0%% in stage N3 and 17.8% in REM.  During the titration period of 234.6 minutes, the patient slept for 219.5 minutes in REM and nonREM, yielding a sleep efficiency of 93.6%%. Sleep onset after CPAP initiation was 1.3 minutes with a REM latency of 55.0 minutes. The patient spent 2.1%% of the night in stage N1 sleep, 62.4%% in stage N2 sleep, 0.0%% in stage N3 and 35.5% in REM.  CARDIAC DATA The 2 lead EKG demonstrated sinus rhythm. The mean heart rate was 100.0 beats per minute. Other EKG findings include: None.  LEG MOVEMENT DATA The total Periodic Limb Movements of Sleep (PLMS) were 0. The PLMS index was 0.0 .  IMPRESSIONS - Mild obstructive sleep apnea occurred during the diagnostic portion of the study (AHI 13.3 /h; RDI 14.5/h); however, sleep apnea was severe during REM sleep (AHI 67.9/h).  CPAP was initiated at 5 cm and was titrated to 13 cm (AHI 4.5/h; RDI 6.0/h; O2 nadir 89%).  - Severe oxygen desaturation was noted during the diagnostic portion of the study to a nadir of 69.0%. - The patient snored with moderate snoring volume during the diagnostic portion of the study. - No cardiac abnormalities were noted during this study. - Clinically significant periodic limb movements did not occur during sleep.  DIAGNOSIS - Obstructive Sleep Apnea (  G47.33)  RECOMMENDATIONS - Recommend an initial trial of CPAP Auto with EPR of 3 at 13 - 20 cm of water with heated humidification. A ResMed AirFit  F20 medium size mask was used for the  titration. - Effort should be made to optimize nasal and oropharyngeal patency. - Avoid alcohol, sedatives and other CNS depressants that may worsen sleep apnea and disrupt normal sleep architecture. - Sleep hygiene should be reviewed to assess factors that may improve sleep quality. - Weight management and regular exercise should be initiated or continued. - Recommend a download and sleep clinic evaluation after 4 weeks of therapy  [Electronically signed] 10/17/2021 04:23 PM  Shelva Majestic MD, [facility], ABSM Diplomate, American Board of Sleep Medicine  NPI: [id number]  [place] PH: [phone]   FX: [phone] [place]

## 2021-10-24 NOTE — Progress Notes (Signed)
[facility] PREP Weekly Session  Patient Details  Name: Samantha [name] MRN: [id number] Date of Birth: Jan 01, 1961 Age: 61 y.o. PCP: Maurice Small, MD  Vitals:   10/24/21 1527  Weight: 271 lb (122.9 kg)     [facility] Weekly seesion - 10/24/21 1500       [facility] "PREP" Location   [facility] "PREP" Location [name]      Weekly Session   Topic Discussed Health habits   Sugar demo, limit added sugars to 24gm/day; Yuka app   Minutes exercised this week 205 minutes    Classes attended to date [place] 10/24/2021, 3:28 PM

## 2021-10-31 ENCOUNTER — Telehealth: Payer: Self-pay | Admitting: *Deleted

## 2021-10-31 NOTE — Progress Notes (Signed)
[facility] PREP Weekly Session  Patient Details  Name: Samantha [name] MRN: [id number] Date of Birth: 30-Mar-1960 Age: 61 y.o. PCP: Maurice Small, MD  Vitals:   10/31/21 1531  Weight: 268 lb (121.6 kg)     [facility] Weekly seesion - 10/31/21 1500       [facility] "PREP" Location   [facility] "PREP" Location [name]      Weekly Session   Topic Discussed Restaurant Eating   Salt demo: Limit salt intake 1500-2300 mg/day   Minutes exercised this week 190 minutes    Classes attended to date [place] 10/31/2021, 3:32 PM

## 2021-10-31 NOTE — Telephone Encounter (Signed)
CPAP order sent to [place] via [facility] portal.

## 2021-10-31 NOTE — Telephone Encounter (Signed)
-----   Message from Troy Sine, MD sent at 10/17/2021  4:30 PM EDT ----- Mariann Laster, please notify patient of the results.  Set up with DME for CPAP initiation.

## 2021-10-31 NOTE — Telephone Encounter (Signed)
Patient notified of sleep study results and recommendations. She agrees to proceed with CPAP set up. All of her questions were answered.

## 2021-11-11 ENCOUNTER — Ambulatory Visit (HOSPITAL_BASED_OUTPATIENT_CLINIC_OR_DEPARTMENT_OTHER): Payer: BC Managed Care – PPO | Admitting: Cardiovascular Disease

## 2021-11-11 ENCOUNTER — Encounter (HOSPITAL_BASED_OUTPATIENT_CLINIC_OR_DEPARTMENT_OTHER): Payer: Self-pay | Admitting: Cardiovascular Disease

## 2021-11-11 DIAGNOSIS — I10 Essential (primary) hypertension: Secondary | ICD-10-CM

## 2021-11-11 DIAGNOSIS — Z6841 Body Mass Index (BMI) 40.0 and over, adult: Secondary | ICD-10-CM | POA: Diagnosis not present

## 2021-11-11 DIAGNOSIS — G4733 Obstructive sleep apnea (adult) (pediatric): Secondary | ICD-10-CM | POA: Diagnosis not present

## 2021-11-11 HISTORY — DX: Obstructive sleep apnea (adult) (pediatric): G47.33

## 2021-11-11 MED ORDER — SEMAGLUTIDE-WEIGHT MANAGEMENT 1 MG/0.5ML ~~LOC~~ SOAJ: 1.0000 mg | SUBCUTANEOUS | 0 refills | Status: AC

## 2021-11-11 MED ORDER — SEMAGLUTIDE-WEIGHT MANAGEMENT 0.5 MG/0.5ML ~~LOC~~ SOAJ: 0.5000 mg | SUBCUTANEOUS | 0 refills | Status: AC

## 2021-11-11 MED ORDER — SEMAGLUTIDE-WEIGHT MANAGEMENT 0.25 MG/0.5ML ~~LOC~~ SOAJ: 0.2500 mg | SUBCUTANEOUS | 0 refills | Status: AC

## 2021-11-11 NOTE — Assessment & Plan Note (Signed)
She has been doing a great job try to work on her diet and exercise.  However her weight loss has been slow.  She has prediabetes and hypertension and hyperlipidemia.  She would be a very good candidate for GLP-1 agonist.  We will start her on Wegovy.  Recommend that she see our Pharm.D for initiation.  Continue working with the [employer].

## 2021-11-11 NOTE — Progress Notes (Signed)
Cardiology Office Note:    Date:  11/11/2021   ID:  Samantha, [name] Jun 25, 1960, MRN [id number]  PCP:  Maurice Small, MD   [name] Providers Cardiologist:  Skeet Latch, MD     Referring MD: Maurice Small, MD   CC:  Hyperlipidemia  History of Present Illness:    Samantha [name] is a 61 y.o. female with a hx of GERD, hypertension, hyperlipidemia, and obesity who presents for follow up. She was initially seen in 2018 with atypical chest pain and shortness of breath.  She ws referred for an echo that was unremarkable.  She also had an ETT that showed poor exercise tolerance.  She was started on amlodipine. However, this was stopped due to increased lower extremity edema.  She has venous insufficiency and had laser treatment 03/24/18. She noted skin discoloration. She wears compression socks and follows up with Dr. Jones [name] at [name]. [name] was started on HCTZ due to poorly controlled BP. She was previously on other statins and had a lot of stiffness.  She previously took Repatha and tolerated it well.  In the interim it was discontinued and she was to start on Praluent. She never filled the prescription. She has been tolerating pravastatin well.  At the last appointment her blood pressure was mildly elevated. She was referred to the PREP program and health and wellness. She had a sleep study and was started on CPAP 09/2021.   Today, she states she has been well and that she has been enjoying the PREP program. She mentions that her knees have been hurting her a bit more than normal, lately. At home, her blood pressure has been fluctuating frequently, with numbers such as 153/90, 120/80, and 151/88. She expresses her desire to start seeing a more stable reading in her blood pressures at home. Her in clinic BP reading was 126/68. When using her own blood pressure cuff shortly after, her BP read 139/90. She reports that she has been exercising and breathing  well.  She denies any palpitations, chest pain, shortness of breath, or peripheral edema. No lightheadedness, headaches, syncope, orthopnea, or PND.   Past Medical History:  Diagnosis Date   Cancer [name])    Colon Cancer adn polyps   Essential hypertension 04/20/2019   GERD (gastroesophageal reflux disease) 04/20/2019   History of hysterectomy    patrial hysterectomy   Hyperlipidemia    Hypothyroidism (acquired)    Multiple thyroid nodules   OSA (obstructive sleep apnea) 11/11/2021   Pure hypercholesterolemia 08/11/2021   Snoring 08/11/2021   Thyroid disease    Hypothyroidism   Vertigo     Past Surgical History:  Procedure Laterality Date   ABDOMINAL HYSTERECTOMY      Current Medications: Current Meds  Medication Sig   acetaminophen (TYLENOL) 500 MG tablet 1 po q 4-6 hrs prn   Ascorbic Acid (VITAMIN C PO) Take by mouth.   aspirin EC 81 MG tablet Take 81 mg by mouth daily. Swallow whole.   CALCIUM PO Take by mouth.   Cholecalciferol (VITAMIN D-3) 125 MCG (5000 UT) TABS Take by mouth.   hydrochlorothiazide (HYDRODIURIL) 25 MG tablet Take 1 tablet (25 mg total) by mouth daily. Please keep upcoming appt for future refills   levothyroxine (SYNTHROID, LEVOTHROID) 88 MCG tablet Take 88 mcg by mouth daily before breakfast.   pravastatin (PRAVACHOL) 20 MG tablet Take 20 mg by mouth daily.   Semaglutide-Weight Management 0.25 MG/0.5ML SOAJ Inject 0.25 mg into the skin  once a week for 28 days.   [START ON 12/10/2021] Semaglutide-Weight Management 0.5 MG/0.5ML SOAJ Inject 0.5 mg into the skin once a week for 28 days.   [START ON 01/08/2022] Semaglutide-Weight Management 1 MG/0.5ML SOAJ Inject 1 mg into the skin once a week for 28 days.   TURMERIC PO Take by mouth.   vitamin E 180 MG (400 UNITS) capsule Take 400 Units by mouth daily.   Current Facility-Administered Medications for the 11/11/21 encounter (Office Visit) with Skeet Latch, MD  Medication   methylPREDNISolone acetate  (DEPO-MEDROL) injection 40 mg     Allergies:   Patient has no known allergies.   Social History   Socioeconomic History   Marital status: Single    Spouse name: Not on file   Number of children: Not on file   Years of education: Not on file   Highest education level: Not on file  Occupational History   Not on file  Tobacco Use   Smoking status: Never   Smokeless tobacco: Never  Substance and Sexual Activity   Alcohol use: No   Drug use: No   Sexual activity: Not on file  Other Topics Concern   Not on file  Social History Narrative   ** Merged History Encounter **       Social Determinants of Health   Financial Resource Strain: Not on file  Food Insecurity: Not on file  Transportation Needs: Not on file  Physical Activity: Not on file  Stress: Not on file  Social Connections: Not on file     Family History: The patient's family history includes Colon cancer in her maternal grandmother; Glaucoma in her mother; Hearing loss in her mother; Hyperlipidemia in her mother; Thyroid disease in her mother.  ROS:   Please see the history of present illness.    (+) Bilateral knee pain  All other systems reviewed and are negative.  EKGs/Labs/Other Studies Reviewed:    The following studies were reviewed today:  03/16/2017  Exercise Tolerance Test: Blood pressure demonstrated a hypertensive response to exercise. There was no ST segment deviation noted during stress.   Poor exercise capacity. Hypertensive response to exertion (210/75 mmHg). No ischemia.   06/26/2016  Echocardiogram: Study Conclusions   - Left ventricle: The cavity size was normal. Systolic function was    normal. The estimated ejection fraction was in the range of 55%    to 60%. Wall motion was normal; there were no regional wall    motion abnormalities. Left ventricular diastolic function    parameters were normal.    EKG: EKG is personally reviewed. 11/11/21: EKG was not ordered.   08/11/2021:  Sinus rhythm.  Rate 65 bpm.  PACs. 10/13/2019: Sinus rhythm.  Rate 64 bpm.  PACs. 04/02/2019: Sinus rhythm.  Rate 67 bpm.  Nonspecific ST T changes.  Recent Labs: No results found for requested labs within last 365 days.   Recent Lipid Panel    Component Value Date/Time   CHOL 167 08/04/2019 0855   TRIG 95 08/04/2019 0855   HDL 58 08/04/2019 0855   CHOLHDL 2.9 08/04/2019 0855   LDLCALC 92 08/04/2019 0855    Physical Exam:    Wt Readings from Last 3 Encounters:  11/11/21 265 lb 12.8 oz (120.6 kg)  10/31/21 268 lb (121.6 kg)  10/24/21 271 lb (122.9 kg)     VS:  BP 126/68 (BP Location: Left Arm, Patient Position: Sitting, Cuff Size: Large)   Pulse 69   Ht '5\' 3"'$  (1.6  m)   Wt 265 lb 12.8 oz (120.6 kg)   SpO2 94%   BMI 47.08 kg/m  , BMI Body mass index is 47.08 kg/m. GENERAL:  Well appearing HEENT: Pupils equal round and reactive, fundi not visualized, oral mucosa unremarkable NECK:  No jugular venous distention, waveform within normal limits, carotid upstroke brisk and symmetric, no bruits, no thyromegaly LUNGS:  Clear to auscultation bilaterally HEART:  RRR.  PMI not displaced or sustained,S1 and S2 within normal limits, no S3, no S4, no clicks, no rubs, no murmurs ABD:  Flat, positive bowel sounds normal in frequency in pitch, no bruits, no rebound, no guarding, no midline pulsatile mass, no hepatomegaly, no splenomegaly EXT:  2 plus pulses throughout, no edema, no cyanosis no clubbing SKIN:  No rashes no nodules NEURO:  Cranial nerves II through XII grossly intact, motor grossly intact throughout PSYCH:  Cognitively intact, oriented to person place and time   ASSESSMENT:    1. Essential hypertension   2. OSA (obstructive sleep apnea)   3. Body mass index 40.0-44.9, adult (HCC)     PLAN:    Essential hypertension At home her BP has been high.  However at her BP is controlled in the office.  Her blood pressure machine was compared side-by-side with ours and is reading  consistently higher.  She will get a new machine.  Continue HCTZ with no additions for now.  Keep working on diet and exercise.  OSA (obstructive sleep apnea) Recently diagnosed with OSA.  She is waiting to be fitted for CPAP.   Body mass index 40.0-44.9, adult [facility]) She has been doing a great job try to work on her diet and exercise.  However her weight loss has been slow.  She has prediabetes and hypertension and hyperlipidemia.  She would be a very good candidate for GLP-1 agonist.  We will start her on Wegovy.  Recommend that she see our Pharm.D for initiation.  Continue working with the [employer] program.    Disposition: FU with Mccabe Gloria C. Oval Linsey, MD, [name] in 4 months.  Medication Adjustments/Labs and Tests Ordered: Current medicines are reviewed at length with the patient today.  Concerns regarding medicines are outlined above.   Orders Placed This Encounter  Procedures   Lipid panel   Comprehensive metabolic panel   Patient Instructions  Medication Instructions:  Your physician has recommended you make the following change in your medication:   Start: VPXTGG  0.'25mg'$  injection weekly for 1 month  0.'5mg'$  weekly for 1 month  1.'0mg'$  weekly for 1 month  We will have you meet with our pharmacist to get this started with you. I have given you samples today, please take this with you to your appointment to learn how to use!  *If you need a refill on your cardiac medications before your next appointment, please call your pharmacy*   Lab Work: Your physician recommends that you return for lab work today- Lipid Panel and CMP  If you have labs (blood work) drawn today and your tests are completely normal, you will receive your results only by: MyChart Message (if you have [place]) OR A paper copy in the mail If you have any lab test that is abnormal or we need to change your treatment, we will call you to review the results.  Follow-Up: At [facility], you and your health needs are  our priority.  As part of our continuing mission to provide you with exceptional heart care, we have created designated Provider Care  Teams.  These Care Teams include your primary Cardiologist (physician) and Advanced Practice Providers (APPs -  Physician Assistants and Nurse Practitioners) who all work together to provide you with the care you need, when you need it.  We recommend signing up for the patient portal called "MyChart".  Sign up information is provided on this After Visit Summary.  MyChart is used to connect with patients for Virtual Visits (Telemedicine).  Patients are able to view lab/test results, encounter notes, upcoming appointments, etc.  Non-urgent messages can be sent to your provider as well.   To learn more about what you can do with MyChart, go to [email or web address].    Your next appointment:   2-3 weeks with PharmD to initiate [facility]   95mowith Dr. RArsenio KatzAdamick,acting as a scribe for TSkeet Latch MD.,have documented all relevant documentation on the behalf of TSkeet Latch MD,as directed by  TSkeet Latch MD while in the presence of TSkeet Latch MD.   I, T[place] CityROval Linsey MD have reviewed all documentation for this visit.  The documentation of the exam, diagnosis, procedures, and orders on 11/11/2021 are all accurate and complete.   Signed, TSkeet Latch MD  11/11/2021 9:48 AM    [facility]

## 2021-11-11 NOTE — Assessment & Plan Note (Addendum)
At home her BP has been high.  However at her BP is controlled in the office.  Her blood pressure machine was compared side-by-side with ours and is reading consistently higher.  She will get a new machine.  Continue HCTZ with no additions for now.  Keep working on diet and exercise.

## 2021-11-11 NOTE — Patient Instructions (Signed)
Medication Instructions:  Your physician has recommended you make the following change in your medication:   Start: ZOXWRU  0.'25mg'$  injection weekly for 1 month  0.'5mg'$  weekly for 1 month  1.'0mg'$  weekly for 1 month  We will have you meet with our pharmacist to get this started with you. I have given you samples today, please take this with you to your appointment to learn how to use!  *If you need a refill on your cardiac medications before your next appointment, please call your pharmacy*   Lab Work: Your physician recommends that you return for lab work today- Lipid Panel and CMP  If you have labs (blood work) drawn today and your tests are completely normal, you will receive your results only by: MyChart Message (if you have [place]) OR A paper copy in the mail If you have any lab test that is abnormal or we need to change your treatment, we will call you to review the results.  Follow-Up: At [facility], you and your health needs are our priority.  As part of our continuing mission to provide you with exceptional heart care, we have created designated Provider Care Teams.  These Care Teams include your primary Cardiologist (physician) and Advanced Practice Providers (APPs -  Physician Assistants and Nurse Practitioners) who all work together to provide you with the care you need, when you need it.  We recommend signing up for the patient portal called "MyChart".  Sign up information is provided on this After Visit Summary.  MyChart is used to connect with patients for Virtual Visits (Telemedicine).  Patients are able to view lab/test results, encounter notes, upcoming appointments, etc.  Non-urgent messages can be sent to your provider as well.   To learn more about what you can do with MyChart, go to [email or web address].    Your next appointment:   2-3 weeks with PharmD to initiate [facility]   3mowith Dr. ROval Linsey

## 2021-11-11 NOTE — Assessment & Plan Note (Signed)
Recently diagnosed with OSA.  She is waiting to be fitted for CPAP.

## 2021-11-12 LAB — COMPREHENSIVE METABOLIC PANEL
ALT: 21 IU/L (ref 0–32)
AST: 25 IU/L (ref 0–40)
Albumin/Globulin Ratio: 1.6 (ref 1.2–2.2)
Albumin: 4.2 g/dL (ref 3.9–4.9)
Alkaline Phosphatase: 81 IU/L (ref 44–121)
BUN/Creatinine Ratio: 9 — ABNORMAL LOW (ref 12–28)
BUN: 8 mg/dL (ref 8–27)
Bilirubin Total: 0.4 mg/dL (ref 0.0–1.2)
CO2: 23 mmol/L (ref 20–29)
Calcium: 9.3 mg/dL (ref 8.7–10.3)
Chloride: 100 mmol/L (ref 96–106)
Creatinine, Ser: 0.87 mg/dL (ref 0.57–1.00)
Globulin, Total: 2.7 g/dL (ref 1.5–4.5)
Glucose: 111 mg/dL — ABNORMAL HIGH (ref 70–99)
Potassium: 3.9 mmol/L (ref 3.5–5.2)
Sodium: 140 mmol/L (ref 134–144)
Total Protein: 6.9 g/dL (ref 6.0–8.5)
eGFR: 75 mL/min/{1.73_m2} (ref >59)

## 2021-11-12 LAB — LIPID PANEL
Chol/HDL Ratio: 4.2 ratio (ref 0.0–4.4)
Cholesterol, Total: 223 mg/dL — ABNORMAL HIGH (ref 100–199)
HDL: 53 mg/dL (ref >39)
LDL Chol Calc (NIH): 150 mg/dL — ABNORMAL HIGH (ref 0–99)
Triglycerides: 112 mg/dL (ref 0–149)
VLDL Cholesterol Cal: 20 mg/dL (ref 5–40)

## 2021-11-14 ENCOUNTER — Telehealth (HOSPITAL_BASED_OUTPATIENT_CLINIC_OR_DEPARTMENT_OTHER): Payer: Self-pay

## 2021-11-14 DIAGNOSIS — E7800 Pure hypercholesterolemia, unspecified: Secondary | ICD-10-CM

## 2021-11-14 MED ORDER — ROSUVASTATIN CALCIUM 20 MG PO TABS: 20.0000 mg | ORAL_TABLET | Freq: Every day | ORAL | 3 refills | Status: DC

## 2021-11-14 NOTE — Telephone Encounter (Addendum)
Results called to patient who verbalizes understanding! Labs ordered and mailed, prescriptions updated and sent to pharmacy on file .     ----- Message from Skeet Latch, MD sent at 11/13/2021  1:28 PM EDT ----- Normal kidney and liver function.  Cholesterol levels are going in the wrong direction.  Recommend that she switch pravastatin to rosuvastatin '20mg'$ .  It is more effective.  Repeat lipids/CMP in 2-3 months.

## 2021-11-14 NOTE — Progress Notes (Signed)
[facility] PREP Weekly Session  Patient Details  Name: Samantha [name] MRN: [id number] Date of Birth: 1960-09-12 Age: 61 y.o. PCP: Maurice Small, MD  Vitals:   11/14/21 1506  Weight: 268 lb (121.6 kg)     [facility] Weekly seesion - 11/14/21 1500       [facility] "PREP" Location   [facility] "PREP" Location [name]      Weekly Session   Topic Discussed Expectations and non-scale victories   Staying positive, halfway thru program---review, revisit, restate goals   Minutes exercised this week 300 minutes    Classes attended to date [name] 11/14/2021, 3:07 PM

## 2021-11-23 NOTE — Progress Notes (Signed)
[facility] PREP Weekly Session  Patient Details  Name: Samantha [name] MRN: [id number] Date of Birth: December 02, 1960 Age: 61 y.o. PCP: Maurice Small, MD  There were no vitals filed for this visit.   [facility] Weekly seesion - 11/23/21 1500       [facility] "PREP" Location   [facility] "PREP" Location [name]      Weekly Session   Topic Discussed Other   Portion control, control your portion size demo; food label review   Classes attended to date [place] 11/23/2021, 3:46 PM

## 2021-11-28 ENCOUNTER — Encounter (HOSPITAL_BASED_OUTPATIENT_CLINIC_OR_DEPARTMENT_OTHER): Payer: Self-pay | Admitting: Cardiovascular Disease

## 2021-11-30 ENCOUNTER — Encounter: Payer: Self-pay | Admitting: Pharmacist Clinician (PhC)/ Clinical Pharmacy Specialist

## 2021-11-30 ENCOUNTER — Ambulatory Visit
Payer: BC Managed Care – PPO | Attending: Cardiovascular Disease | Admitting: Pharmacist Clinician (PhC)/ Clinical Pharmacy Specialist

## 2021-11-30 NOTE — Progress Notes (Signed)
HPI: Samantha [name] is a 61 y.o. female patient referred to pharmacy clinic by Dr. Oval Linsey to initiate weight loss therapy with GLP1-RA.  Most recent BMI 47.08, when she saw Dr. Oval Linsey last month.  Patient notes that she has previously worked with [employer] and was able to lose 30-40 pounds, but found that once she stopped the weight came right back on.  She has had similar experiences with other diets in the past, finding that her weight would just yo-yo as she started and stopped different diets.   In the past month she has been working with the PREP exercise program at a nearby [employer] and is enjoying the exercise training.      Significant medical history: hypertension Well controlled today on hctz 25 mg tabs  hyperlipidemia              Current weight management medications: none  Previously tried meds: none  Current meds that may affect weight: none  Baseline weight/BMI: 48  Insurance payor: [employer]  Diet: first meal is usually around 11 am - before that coffee (w/c&S), usually eats 11 am 6 pm  Firs meal is breakfast food - bacon/sausage, toast, will sometimes do salad or fruit  Does eat fast food - [employer],  at home is mixed frozen vegetables, frozen fish, dark meat chicken  Not really snacking in day, likes chips at night, can eat whole bag   Exercise: PREP exercise class - learning a lot; trying to get 30 min/day, prefers to walk outside opposed to elliptical,   Family History: sleep apnea in family, no MI/stroke in parents, no whole siblings, mom now 55, 1 son 47 also overweight;   Confirmed patient not pregnant and no personal or family history of medullary thyroid carcinoma (MTC) or Multiple Endocrine Neoplasia syndrome type 2 (MEN 2).   Social History: no tobacco, occasional alcohol; coffee daily  Labs: No results found for: "HGBA1C"  Wt Readings from Last 1 Encounters:  11/30/21 270 lb 12.8 oz (122.8 kg)    BP Readings from  Last 1 Encounters:  11/30/21 122/79   Pulse Readings from Last 1 Encounters:  11/11/21 69       Component Value Date/Time   CHOL 223 (H) 11/11/2021 0932   TRIG 112 11/11/2021 0932   HDL 53 11/11/2021 0932   CHOLHDL 4.2 11/11/2021 0932   LDLCALC 150 (H) 11/11/2021 0932    Past Medical History:  Diagnosis Date   Cancer ([place])    Colon Cancer adn polyps   Essential hypertension 04/20/2019   GERD (gastroesophageal reflux disease) 04/20/2019   History of hysterectomy    patrial hysterectomy   Hyperlipidemia    Hypothyroidism (acquired)    Multiple thyroid nodules   OSA (obstructive sleep apnea) 11/11/2021   Pure hypercholesterolemia 08/11/2021   Snoring 08/11/2021   Thyroid disease    Hypothyroidism   Vertigo     Current Outpatient Medications on File Prior to Visit  Medication Sig Dispense Refill   acetaminophen (TYLENOL) 500 MG tablet 1 po q 4-6 hrs prn 60 tablet 0   Ascorbic Acid (VITAMIN C PO) Take by mouth.     aspirin EC 81 MG tablet Take 81 mg by mouth daily. Swallow whole.     CALCIUM PO Take by mouth.     Cholecalciferol (VITAMIN D-3) 125 MCG (5000 UT) TABS Take by mouth.     hydrochlorothiazide (HYDRODIURIL) 25 MG tablet Take 1 tablet (25 mg total) by mouth daily. Please  keep upcoming appt for future refills 90 tablet 0   levothyroxine (SYNTHROID, LEVOTHROID) 88 MCG tablet Take 88 mcg by mouth daily before breakfast.     rosuvastatin (CRESTOR) 20 MG tablet Take 1 tablet (20 mg total) by mouth daily. 90 tablet 3   Semaglutide-Weight Management 0.25 MG/0.5ML SOAJ Inject 0.25 mg into the skin once a week for 28 days. 2 mL 0   [START ON 12/10/2021] Semaglutide-Weight Management 0.5 MG/0.5ML SOAJ Inject 0.5 mg into the skin once a week for 28 days. 2 mL 0   [START ON 01/08/2022] Semaglutide-Weight Management 1 MG/0.5ML SOAJ Inject 1 mg into the skin once a week for 28 days. 2 mL 0   TURMERIC PO Take by mouth.     vitamin E 180 MG (400 UNITS) capsule Take 400 Units by mouth  daily.     Current Facility-Administered Medications on File Prior to Visit  Medication Dose Route Frequency Provider Last Rate Last Admin   methylPREDNISolone acetate (DEPO-MEDROL) injection 40 mg  40 mg Intra-articular Once Hilts, Michael, MD        No Known Allergies  Morbid obesity ([place]) Patient has not met goal of at least 5% of body weight loss with comprehensive lifestyle modifications alone in the past 3-6 months. Pharmacotherapy is appropriate to pursue as augmentation. Will start Wegovy.   Confirmed patient not pregnant and no personal or family history of medullary thyroid carcinoma (MTC) or Multiple Endocrine Neoplasia syndrome type 2 (MEN 2).   Advised patient on common side effects including nausea, diarrhea, dyspepsia, decreased appetite, and fatigue. Counseled patient on reducing meal size and how to titrate medication to minimize side effects. Patient aware to call if intolerable side effects or if experiencing dehydration, abdominal pain, or dizziness. Patient will adhere to dietary modifications and will target at least 150 minutes of moderate intensity exercise weekly.   Injection technique reviewed at today's visit.  Titration Plan:  Will plan to follow the titration plan as below, pending patient is tolerating each dose before increasing to the next. Can slow titration if needed for tolerability.    -Month 1: Inject 0.25 mg SQ once weekly x 4 weeks -Month 2: Inject 0.5 mg SQ once weekly x 4 weeks -Month 3: Inject 1 mg SQ once weekly x 4 weeks -Month 4+: Inject 1.7 mg SQ once weekly   Follow up 3 months after starting medication  Because of medication shortages nationwide, patient was advised not to start the 0.25 mg dose until she has the 0.5 mg in hand.  Once she uses the 4th pen, she should contact pharmacy about getting on wait list for the 1 mg dose.  Hopefully ordering 4 weeks ahead of schedule will help avoid any delays in obtaining medication.    Tommy Medal PharmD CPP [facility] [address] [place], [place] [address]

## 2021-11-30 NOTE — Patient Instructions (Signed)
We will start the prior authorization process to get Weg covered by your insurance.   TIPS FOR SUCCESS Write down the reasons why you want to lose weight and post it in a place where you'll see it often. Start small and work your way up. Keep in mind that it takes time to achieve goals, and small steps add up. Any additional movements help to burn calories. Taking the stairs rather than the elevator and parking at the far end of your parking lot are easy ways to start. Brisk walking for at least 30 minutes 4 or more days of the week is an excellent goal to work toward  [place] Did you know that it can take 15 minutes or more for your brain to receive the message that you've eaten? That means that, if you eat less food, but consume it slower, you may still feel satisfied. Eating a lot of fruits and vegetables can also help you feel fuller. Eat off of smaller plates so that moderate portions don't seem too small  TITRATION PLAN Will plan to follow the titration plan as below, pending patient is tolerating each dose before increasing to the next. Can slow titration if needed for tolerability.    -Weeks 1-4: Inject 0.25 mg SQ once weekly  -Weeks 5-8: Inject 0.5 mg SQ once weekly  -Weeks 9-12 Inject 1 mg SQ once weekly  -Weeks 13-16: Inject 1.7 SQ once weekly   Follow up 3 months after starting medication.  If you have any questions or concerns, please reach out to [place].  Riane Rung/Chris at [phone].  [place]

## 2021-11-30 NOTE — Assessment & Plan Note (Addendum)
Patient has not met goal of at least 5% of body weight loss with comprehensive lifestyle modifications alone in the past 3-6 months. Pharmacotherapy is appropriate to pursue as augmentation. Will start Wegovy.   Confirmed patient not pregnant and no personal or family history of medullary thyroid carcinoma (MTC) or Multiple Endocrine Neoplasia syndrome type 2 (MEN 2).   Advised patient on common side effects including nausea, diarrhea, dyspepsia, decreased appetite, and fatigue. Counseled patient on reducing meal size and how to titrate medication to minimize side effects. Patient aware to call if intolerable side effects or if experiencing dehydration, abdominal pain, or dizziness. Patient will adhere to dietary modifications and will target at least 150 minutes of moderate intensity exercise weekly.   Injection technique reviewed at today's visit.  Titration Plan:  Will plan to follow the titration plan as below, pending patient is tolerating each dose before increasing to the next. Can slow titration if needed for tolerability.    -Month 1: Inject 0.25 mg SQ once weekly x 4 weeks -Month 2: Inject 0.5 mg SQ once weekly x 4 weeks -Month 3: Inject 1 mg SQ once weekly x 4 weeks -Month 4+: Inject 1.7 mg SQ once weekly   Follow up 3 months after starting medication  Because of medication shortages nationwide, patient was advised not to start the 0.25 mg dose until she has the 0.5 mg in hand.  Once she uses the 4th pen, she should contact pharmacy about getting on wait list for the 1 mg dose.  Hopefully ordering 4 weeks ahead of schedule will help avoid any delays in obtaining medication.

## 2021-12-04 ENCOUNTER — Encounter: Payer: Self-pay | Admitting: Pharmacist Clinician (PhC)/ Clinical Pharmacy Specialist

## 2021-12-05 ENCOUNTER — Encounter: Payer: Self-pay | Admitting: *Deleted

## 2021-12-05 MED ORDER — SEMAGLUTIDE-WEIGHT MANAGEMENT 0.5 MG/0.5ML ~~LOC~~ SOAJ
0.5000 mg | SUBCUTANEOUS | 1 refills | Status: DC
  Filled 2021-12-16: qty 2, 28d supply, fill #0
  Filled 2022-06-02: qty 2, 28d supply, fill #1

## 2021-12-05 MED ORDER — SEMAGLUTIDE-WEIGHT MANAGEMENT 1.7 MG/0.75ML ~~LOC~~ SOAJ
1.7000 mg | SUBCUTANEOUS | 1 refills | Status: DC
  Filled 2022-05-11: qty 3, 28d supply, fill #0
  Filled 2022-06-02 – 2022-06-07 (×2): qty 3, 28d supply, fill #1

## 2021-12-05 MED ORDER — SEMAGLUTIDE-WEIGHT MANAGEMENT 1 MG/0.5ML ~~LOC~~ SOAJ
1.0000 mg | SUBCUTANEOUS | 1 refills | Status: DC
  Filled 2022-01-25: qty 2, 28d supply, fill #0
  Filled 2022-03-21: qty 2, 28d supply, fill #1

## 2021-12-05 MED ORDER — SEMAGLUTIDE-WEIGHT MANAGEMENT 2.4 MG/0.75ML ~~LOC~~ SOAJ
2.4000 mg | SUBCUTANEOUS | 1 refills | Status: DC
  Filled 2022-06-02: qty 3, 28d supply, fill #0

## 2021-12-05 NOTE — Progress Notes (Signed)
[facility] PREP Weekly Session  Patient Details  Name: Samantha [name] MRN: [id number] Date of Birth: December 20, 1960 Age: 61 y.o. PCP: Maurice Small, MD  Vitals:   12/05/21 1553  Weight: 265 lb (120.2 kg)     [facility] Weekly seesion - 12/05/21 1500       [facility] "PREP" Location   [facility] "PREP" Location [name]      Weekly Session   Topic Discussed Calorie breakdown   Review macronutrients, discuss ways to eat healthy on a budget, importance of protein   Minutes exercised this week 240 minutes    Classes attended to date [place], [place] 12/05/2021, 3:56 PM

## 2021-12-11 ENCOUNTER — Other Ambulatory Visit (HOSPITAL_BASED_OUTPATIENT_CLINIC_OR_DEPARTMENT_OTHER): Payer: Self-pay | Admitting: Cardiovascular Disease

## 2021-12-12 NOTE — Telephone Encounter (Signed)
Rx request sent to pharmacy.  

## 2021-12-16 ENCOUNTER — Other Ambulatory Visit (HOSPITAL_BASED_OUTPATIENT_CLINIC_OR_DEPARTMENT_OTHER): Payer: Self-pay

## 2021-12-19 ENCOUNTER — Other Ambulatory Visit (HOSPITAL_BASED_OUTPATIENT_CLINIC_OR_DEPARTMENT_OTHER): Payer: Self-pay

## 2021-12-19 ENCOUNTER — Ambulatory Visit (INDEPENDENT_AMBULATORY_CARE_PROVIDER_SITE_OTHER): Payer: BC Managed Care – PPO | Admitting: Internal Medicine

## 2021-12-19 ENCOUNTER — Encounter: Payer: Self-pay | Admitting: *Deleted

## 2021-12-19 VITALS — BP 139/83 | HR 86 | Ht 62.0 in | Wt 261.8 lb

## 2021-12-19 DIAGNOSIS — E7800 Pure hypercholesterolemia, unspecified: Secondary | ICD-10-CM

## 2021-12-19 DIAGNOSIS — I10 Essential (primary) hypertension: Secondary | ICD-10-CM

## 2021-12-19 DIAGNOSIS — Z0289 Encounter for other administrative examinations: Secondary | ICD-10-CM

## 2021-12-19 DIAGNOSIS — Z6841 Body Mass Index (BMI) 40.0 and over, adult: Secondary | ICD-10-CM

## 2021-12-19 DIAGNOSIS — G4733 Obstructive sleep apnea (adult) (pediatric): Secondary | ICD-10-CM

## 2021-12-19 NOTE — Progress Notes (Signed)
[facility] PREP Weekly Session  Patient Details  Name: Amandamarie Feggins MRN: [id number] Date of Birth: Apr 20, 1960 Age: 61 y.o. PCP: Maurice Small, MD  Vitals:   12/19/21 1542  Weight: 260 lb (117.9 kg)     [facility] Weekly seesion - 12/19/21 1500       [facility] "PREP" Location   [facility] "PREP" Location [name]      Weekly Session   Topic Discussed Hitting roadblocks   [facility] membership talk by Merrilee Seashore, Final FIT testing, review and questions.   Minutes exercised this week 220 minutes    Classes attended to date [place] 12/19/2021, 3:45 PM

## 2021-12-19 NOTE — Progress Notes (Signed)
Office: [phone]  /  Fax: [phone]  Initial Visit  Samantha [name] was seen in clinic today to evaluate for obesity. She is interested in losing weight to improve overall health and reduce the risk of weight related complications.  She was referred by her cardiologist.  She has also been referred to the diabetes prevention program.  Her peak weight is 274 actual weight 260 pounds.  Associated conditions include hypertension, OSA, GERD, hyperlipidemia, pre-DM and osteoarthritis of the left knee.  Her son is overweight.  She had done weight watchers in the past and did well but went on a 2-week vacation and never got back on track.  She was also recently started on Wegovy by her cardiologist but has not started medication.  She has medication in her possession.  She is interested in participating in our program for accountability and guidance regarding nutrition.  She is not following a particular nutritional plan but reports making healthy choices.  She is working from home and is not as active.   She presents today to review program treatment options, initial physical assessment, and evaluation.      Past medical history includes:   Past Medical History:  Diagnosis Date   Cancer ([place])    Colon Cancer adn polyps   Essential hypertension 04/20/2019   GERD (gastroesophageal reflux disease) 04/20/2019   History of hysterectomy    patrial hysterectomy   Hyperlipidemia    Hypothyroidism (acquired)    Multiple thyroid nodules   OSA (obstructive sleep apnea) 11/11/2021   Pure hypercholesterolemia 08/11/2021   Snoring 08/11/2021   Thyroid disease    Hypothyroidism   Vertigo      Objective:   BP 139/83 (BP Location: Left Arm, Patient Position: Sitting, Cuff Size: Large)   Pulse 86   Ht '5\' 2"'$  (1.575 m)   Wt 261 lb 12.8 oz (118.8 kg)   SpO2 96%   BMI 47.88 kg/m  She was weighed on the bioimpedance scale:  Body mass index is 47.88 kg/m.  General:  Alert, oriented and  cooperative. Patient is in no acute distress.  Respiratory: Normal respiratory effort, no problems with respiration noted  Extremities: Normal range of motion.    Mental Status: Normal mood and affect. Normal behavior. Normal judgment and thought content.   Assessment and Plan:  1. Class 3 severe obesity with serious comorbidity and body mass index (BMI) of 45.0 to 49.9 in adult, unspecified obesity type ([place])  2. OSA (obstructive sleep apnea)  3. Essential hypertension  4. Pure hypercholesterolemia   We reviewed weight, associated conditions, contributing factors as well as the benefits of weight loss therapy on her risk of diabetes, OSA and hypertension.  She is not following a structured meal plan and had some success with weight watchers in the past.  She has been prescribed Wegovy by cardiology but has not started medication.  Patient would benefit from a reduced calorie high-protein low-carb meal plan and also resistance training.  We will work with patient longitudinally to implement these changes.  Her blood pressure is close to target.  She will continue hydrochlorothiazide.  She had an LDL cholesterol at 150 and is on high-dose statin therapy.  We will check a fasting lipid panel with her intake labs.  She also reports having a history of pre-DM so were going to be checking fasting blood sugar, hemoglobin A1c as well as insulin levels.     Obesity Treatment Plan:  She will work on garnering support from family  and friends to begin weight loss journey. Work on eliminating or reducing the presence of highly processed, calorie dense foods in the home. Complete provided nutritional and psychosocial assessment questionnaire.    Samantha [name] will follow up in the next 1-2 weeks to review the above steps and continue evaluation and treatment.  Obesity Education Performed Today:  She was weighed on the bioimpedance scale and results were discussed and documented in the  synopsis.  We discussed obesity as a disease and the importance of a more detailed evaluation of all the factors contributing to the disease.  We discussed the importance of long term lifestyle changes which include nutrition, exercise and behavioral modifications as well as the importance of customizing this to her specific health and social needs.  We discussed the benefits of reaching a healthier weight to alleviate the symptoms of existing conditions and reduce the risks of the biomechanical, metabolic and psychological effects of obesity.  We discussed the goals of this program is to improve her overall health and not simply achieve a specific BMI.  Frequent visits are very important to patient success. I plan to see her every 2 weeks for the first 3 months and then evaluate the visit frequency after that time. I explained obesity is a life-long chronic disease and long term treatments would be required. Medications to help her follow his eating plan may be offered as appropriate but are not required. All medication decisions will be made together after the initial workup is done and benefits and side effects are discussed in depth.  The clinic rules were reviewed including the late policy, cancellation policy, no show and program fees.  Samantha [name] appears to be in the action stage of change and states they are ready to start intensive lifestyle modifications and behavioral modifications.  30 minutes was spent today on this visit including the above counseling, pre-visit chart review, and post-visit documentation.  Thomes Dinning, MD

## 2021-12-20 ENCOUNTER — Other Ambulatory Visit: Payer: Self-pay | Admitting: Family Medicine

## 2021-12-20 DIAGNOSIS — E039 Hypothyroidism, unspecified: Secondary | ICD-10-CM

## 2021-12-20 DIAGNOSIS — E042 Nontoxic multinodular goiter: Secondary | ICD-10-CM

## 2021-12-21 ENCOUNTER — Encounter: Payer: Self-pay | Admitting: *Deleted

## 2021-12-21 NOTE — Progress Notes (Signed)
[facility] PREP Evaluation  Patient Details  Name: Samantha [name] MRN: [id number] Date of Birth: 1960/11/22 Age: 61 y.o. PCP: Maurice Small, MD  Vitals:   12/21/21 1502  BP: 132/74  Pulse: 67  Resp: 20  SpO2: 97%  Weight: 263 lb (119.3 kg)     [facility] Eval - 12/21/21 1500       [facility] "PREP" Location   [facility] "PREP" Location [name]      Referral    Referring Provider [place]    Program Start Date 10/03/21      Measurement   Waist Circumference 47.5 inches    Hip Circumference 52.75 inches    Body fat 49.7 percent      Mobility and Daily Activities   I find it easy to walk up or down two or more flights of stairs. 3    I have no trouble taking out the trash. 4    I do housework such as vacuuming and dusting on my own without difficulty. 4    I can easily lift a gallon of milk (8lbs). 4    I can easily walk a mile. 4    I have no trouble reaching into high cupboards or reaching down to pick up something from the floor. 4    I do not have trouble doing out-door work such as Armed forces logistics/support/administrative officer, raking leaves, or gardening. 4      Mobility and Daily Activities   I feel younger than my age. 3    I feel independent. 4    I feel energetic. 4    I live an active life.  4    I feel strong. 4    I feel healthy. 3    I feel active as other people my age. 4      How fit and strong are you.   Fit and Strong Total Score 53            Past Medical History:  Diagnosis Date   Cancer ([place])    Colon Cancer adn polyps   Essential hypertension 04/20/2019   GERD (gastroesophageal reflux disease) 04/20/2019   History of hysterectomy    patrial hysterectomy   Hyperlipidemia    Hypothyroidism (acquired)    Multiple thyroid nodules   OSA (obstructive sleep apnea) 11/11/2021   Pure hypercholesterolemia 08/11/2021   Snoring 08/11/2021   Thyroid disease    Hypothyroidism   Vertigo    Past Surgical History:  Procedure Laterality Date   ABDOMINAL HYSTERECTOMY     Social  History   Tobacco Use  Smoking Status Never  Smokeless Tobacco Never    [name] 12/21/2021, 3:07 PM  Completed PREP. Attended 8 educational sessions and 7 exercise classes. In 12 weeks she has lost 8.8 lbs. Waist circumference decreased by 2.25 inches, hips 2.25 inches. Fit and Strong survey has improved by 9 points.

## 2022-01-09 ENCOUNTER — Other Ambulatory Visit: Payer: Self-pay | Admitting: Family Medicine

## 2022-01-09 ENCOUNTER — Other Ambulatory Visit (HOSPITAL_COMMUNITY)
Admission: RE | Admit: 2022-01-09 | Discharge: 2022-01-09 | Disposition: A | Discharge status: Home / Self Care | Payer: BC Managed Care – PPO | Source: Ambulatory Visit | Attending: Family Medicine | Admitting: Family Medicine

## 2022-01-09 DIAGNOSIS — Z01419 Encounter for gynecological examination (general) (routine) without abnormal findings: Secondary | ICD-10-CM | POA: Insufficient documentation

## 2022-01-10 LAB — CYTOLOGY - PAP
Comment: NEGATIVE
Diagnosis: NEGATIVE
High risk HPV: NEGATIVE

## 2022-01-11 ENCOUNTER — Other Ambulatory Visit: Payer: Self-pay | Admitting: Family Medicine

## 2022-01-11 DIAGNOSIS — N644 Mastodynia: Secondary | ICD-10-CM

## 2022-01-11 DIAGNOSIS — N6341 Unspecified lump in right breast, subareolar: Secondary | ICD-10-CM

## 2022-01-17 ENCOUNTER — Encounter (INDEPENDENT_AMBULATORY_CARE_PROVIDER_SITE_OTHER): Payer: Self-pay

## 2022-01-17 ENCOUNTER — Encounter (INDEPENDENT_AMBULATORY_CARE_PROVIDER_SITE_OTHER): Payer: Self-pay | Admitting: Internal Medicine

## 2022-01-17 DIAGNOSIS — R0602 Shortness of breath: Secondary | ICD-10-CM | POA: Insufficient documentation

## 2022-01-17 DIAGNOSIS — R5383 Other fatigue: Secondary | ICD-10-CM | POA: Insufficient documentation

## 2022-01-17 DIAGNOSIS — Z1331 Encounter for screening for depression: Secondary | ICD-10-CM | POA: Insufficient documentation

## 2022-01-19 ENCOUNTER — Ambulatory Visit
Admission: RE | Admit: 2022-01-19 | Discharge: 2022-01-19 | Disposition: A | Discharge status: Home / Self Care | Payer: BC Managed Care – PPO | Source: Ambulatory Visit | Attending: Family Medicine | Admitting: Family Medicine

## 2022-01-19 DIAGNOSIS — E039 Hypothyroidism, unspecified: Secondary | ICD-10-CM

## 2022-01-19 DIAGNOSIS — E042 Nontoxic multinodular goiter: Secondary | ICD-10-CM

## 2022-01-25 ENCOUNTER — Encounter (INDEPENDENT_AMBULATORY_CARE_PROVIDER_SITE_OTHER): Payer: Self-pay | Admitting: Internal Medicine

## 2022-01-25 ENCOUNTER — Ambulatory Visit (INDEPENDENT_AMBULATORY_CARE_PROVIDER_SITE_OTHER): Payer: BC Managed Care – PPO | Admitting: Internal Medicine

## 2022-01-25 ENCOUNTER — Other Ambulatory Visit (HOSPITAL_BASED_OUTPATIENT_CLINIC_OR_DEPARTMENT_OTHER): Payer: Self-pay

## 2022-01-25 VITALS — BP 130/82 | HR 75 | Temp 97.9°F | Ht 62.0 in | Wt 252.0 lb

## 2022-01-25 DIAGNOSIS — Z1331 Encounter for screening for depression: Secondary | ICD-10-CM | POA: Diagnosis not present

## 2022-01-25 DIAGNOSIS — I10 Essential (primary) hypertension: Secondary | ICD-10-CM

## 2022-01-25 DIAGNOSIS — Z6841 Body Mass Index (BMI) 40.0 and over, adult: Secondary | ICD-10-CM

## 2022-01-25 DIAGNOSIS — R5383 Other fatigue: Secondary | ICD-10-CM

## 2022-01-25 DIAGNOSIS — E669 Obesity, unspecified: Secondary | ICD-10-CM

## 2022-01-25 DIAGNOSIS — R0602 Shortness of breath: Secondary | ICD-10-CM | POA: Diagnosis not present

## 2022-01-25 DIAGNOSIS — E7849 Other hyperlipidemia: Secondary | ICD-10-CM | POA: Diagnosis not present

## 2022-01-25 DIAGNOSIS — R7303 Prediabetes: Secondary | ICD-10-CM

## 2022-01-25 DIAGNOSIS — G4733 Obstructive sleep apnea (adult) (pediatric): Secondary | ICD-10-CM

## 2022-01-31 ENCOUNTER — Ambulatory Visit (INDEPENDENT_AMBULATORY_CARE_PROVIDER_SITE_OTHER): Payer: BC Managed Care – PPO | Admitting: Internal Medicine

## 2022-02-09 LAB — CBC WITH DIFFERENTIAL/PLATELET
Basophils Absolute: 0 10*3/uL (ref 0.0–0.2)
Basos: 1 %
EOS (ABSOLUTE): 0.2 10*3/uL (ref 0.0–0.4)
Eos: 3 %
Hematocrit: 39.1 % (ref 34.0–46.6)
Hemoglobin: 13 g/dL (ref 11.1–15.9)
Immature Grans (Abs): 0 10*3/uL (ref 0.0–0.1)
Immature Granulocytes: 0 %
Lymphocytes Absolute: 2.7 10*3/uL (ref 0.7–3.1)
Lymphs: 45 %
MCH: 28.3 pg (ref 26.6–33.0)
MCHC: 33.2 g/dL (ref 31.5–35.7)
MCV: 85 fL (ref 79–97)
Monocytes Absolute: 0.4 10*3/uL (ref 0.1–0.9)
Monocytes: 6 %
Neutrophils Absolute: 2.7 10*3/uL (ref 1.4–7.0)
Neutrophils: 45 %
Platelets: 320 10*3/uL (ref 150–450)
RBC: 4.59 x10E6/uL (ref 3.77–5.28)
RDW: 13.5 % (ref 11.7–15.4)
WBC: 6 10*3/uL (ref 3.4–10.8)

## 2022-02-09 LAB — INSULIN, RANDOM: INSULIN: 22.7 u[IU]/mL (ref 2.6–24.9)

## 2022-02-09 LAB — VITAMIN B12: Vitamin B-12: 714 pg/mL (ref 232–1245)

## 2022-02-09 LAB — VITAMIN D 25 HYDROXY (VIT D DEFICIENCY, FRACTURES): Vit D, 25-Hydroxy: 41.6 ng/mL (ref 30.0–100.0)

## 2022-02-13 NOTE — Progress Notes (Unsigned)
Chief Complaint:   OBESITY Samantha [name] (MR# [id number]) is a 61 y.o. female who presents for evaluation and treatment of obesity and related comorbidities. Current BMI is Body mass index is 46.09 kg/m. Samantha [name] has been struggling with her weight for many years and has been unsuccessful in either losing weight, maintaining weight loss, or reaching her healthy weight goal.  Samantha [name] is currently in the action stage of change and ready to dedicate time achieving and maintaining a healthier weight. Samantha [name] is interested in becoming our patient and working on intensive lifestyle modifications including (but not limited to) diet and exercise for weight loss.  Samantha [name]'s habits were reviewed today and are as follows: Her family eats meals together, she thinks her family will eat healthier with her, her desired weight loss is 107 lbs, she has been heavy most of her life, she started gaining weight 30 years, her heaviest weight ever was 275 pounds, she has significant food cravings issues, she wakes up frequently in the middle of the night to eat, she skips meals frequently, she is frequently drinking liquids with calories, she frequently makes poor food choices, she frequently eats larger portions than normal, she has binge eating behaviors, and she struggles with emotional eating.  Depression Screen Samantha [name]'s Food and Mood (modified PHQ-9) score was 6.  Subjective:   1. Other fatigue Samantha [name] admits to daytime somnolence and admits to waking up still tired. Patient has a history of symptoms of daytime fatigue and morning fatigue. Samantha [name] generally gets 6 hours of sleep per night, and states that she has generally restful sleep. Snoring is present. Apneic episodes are not present. Epworth Sleepiness Score is 4. TSH was 2.23 and within normal limits at [facility] 10/23.  2. SOB (shortness of breath) on exertion Samantha [name] notes increasing shortness of breath with exercising and seems to be worsening over time with  weight gain. She notes getting out of breath sooner with activity than she used to. This has gotten worse recently. Samantha [name] denies shortness of breath at rest or orthopnea.  3. Essential hypertension Discussed labs with patient today. BP well controlled . Pt has no orthostatic dizziness. Renal parameters within normal limits on labs. Pt is on HCTZ. CMP within normal limits at [facility] 10/23.  4. Other hyperlipidemia LDL cholesterol was 150 she is now on rosuvastatin 20 mg and will be due for repeat lipid panel.  5. OSA (obstructive sleep apnea) Pt is in the process of getting equipment.  6. Prediabetes Discussed labs with patient today. A1c was 6.3 in October 2023.  Assessment/Plan:   1. Other fatigue Samantha [name] does feel that her weight is causing her energy to be lower than it should be. Fatigue may be related to obesity, depression or many other causes. Labs will be ordered, and in the meanwhile, Samantha [name] will focus on self care including making healthy food choices, increasing physical activity and focusing on stress reduction.  Lab/Orders today or future: - Vitamin B12 - CBC with Differential/Platelet - Insulin, random - VITAMIN D 25 Hydroxy (Vit-D Deficiency, Fractures)  2. SOB (shortness of breath) on exertion Samantha [name] does feel that she gets out of breath more easily that she used to when she exercises. Samantha [name]'s shortness of breath appears to be obesity related and exercise induced. She has agreed to work on weight loss and gradually increase exercise to treat her exercise induced shortness of breath. Will continue to monitor closely.  3. Essential hypertension Continue regimen and weight loss therapy.  4. Other hyperlipidemia Continue statin and weight loss therapy.  Check fasting lipid panel for response to statin therapy  5. OSA (obstructive sleep apnea) Weight loss therapy of 15% would reverse apnea-hypopnea index.  6. Prediabetes Check A1c and insulin levels.  - Insulin,  random  7. Depression screening Samantha [name] had a positive depression screening. Depression is commonly associated with obesity and often results in emotional eating behaviors. We will monitor this closely and work on CBT to help improve the non-hunger eating patterns. Referral to Psychology may be required if no improvement is seen as she continues in our clinic.  8. Class 3 severe obesity with serious comorbidity and body mass index (BMI) of 45.0 to 49.9 in adult, unspecified obesity type (HCC) Samantha [name] is currently in the action stage of change and her goal is to continue with weight loss efforts. I recommend Samantha [name] begin the structured treatment plan as follows:  She has agreed to the Category 2 Plan.  Check Vitamin D. Start reduced calorie meal plan. Continue Wegovy as adjunct. However, it may need to be discontinued due to changes in coverage.  Exercise goals:  As is    Behavioral modification strategies: increasing lean protein intake, increasing vegetables, increasing water intake, decreasing eating out, no skipping meals, meal planning and cooking strategies, keeping healthy foods in the home, better snacking choices, avoiding temptations, and planning for success.  She was informed of the importance of frequent follow-up visits to maximize her success with intensive lifestyle modifications for her multiple health conditions. She was informed we would discuss her lab results at her next visit unless there is a critical issue that needs to be addressed sooner. Samantha [name] agreed to keep her next visit at the agreed upon time to discuss these results.  Objective:   Blood pressure 130/82, pulse 75, temperature 97.9 F (36.6 C), height '5\' 2"'$  (1.575 m), weight 252 lb (114.3 kg), SpO2 97 %. Body mass index is 46.09 kg/m.  EKG: Normal sinus rhythm, rate 65.  Indirect Calorimeter completed today shows a VO2 of 271 and a REE of 1872.  Her calculated basal metabolic rate is 7510 thus her basal metabolic  rate is better than expected.  General: Cooperative, alert, well developed, in no acute distress. HEENT: Conjunctivae and lids unremarkable. Cardiovascular: Regular rhythm.  Lungs: Normal work of breathing. Neurologic: No focal deficits.   Lab Results  Component Value Date   CREATININE 0.87 11/11/2021   BUN 8 11/11/2021   NA 140 11/11/2021   K 3.9 11/11/2021   CL 100 11/11/2021   CO2 23 11/11/2021   Lab Results  Component Value Date   ALT 21 11/11/2021   AST 25 11/11/2021   ALKPHOS 81 11/11/2021   BILITOT 0.4 11/11/2021   No results found for: "HGBA1C" Lab Results  Component Value Date   INSULIN 22.7 02/07/2022   No results found for: "TSH" Lab Results  Component Value Date   CHOL 223 (H) 11/11/2021   HDL 53 11/11/2021   LDLCALC 150 (H) 11/11/2021   TRIG 112 11/11/2021   CHOLHDL 4.2 11/11/2021   Lab Results  Component Value Date   WBC 6.0 02/07/2022   HGB 13.0 02/07/2022   HCT 39.1 02/07/2022   MCV 85 02/07/2022   PLT 320 02/07/2022    Attestation Statements:   Reviewed by clinician on day of visit: allergies, medications, problem list, medical history, surgical history, family history, social history, and previous encounter notes.  Time spent on visit including pre-visit chart review and  post-visit charting and care was 40 minutes.   I, Kathlene November, BS, CMA, am acting as transcriptionist for Thomes Dinning, MD.  I have reviewed the above documentation for accuracy and completeness, and I agree with the above. -Thomes Dinning, MD

## 2022-02-15 ENCOUNTER — Encounter (INDEPENDENT_AMBULATORY_CARE_PROVIDER_SITE_OTHER): Payer: Self-pay | Admitting: Internal Medicine

## 2022-02-15 ENCOUNTER — Ambulatory Visit (INDEPENDENT_AMBULATORY_CARE_PROVIDER_SITE_OTHER): Payer: BC Managed Care – PPO | Admitting: Internal Medicine

## 2022-02-15 VITALS — BP 128/87 | HR 60 | Temp 97.7°F | Ht 62.0 in | Wt 247.0 lb

## 2022-02-15 DIAGNOSIS — R7303 Prediabetes: Secondary | ICD-10-CM

## 2022-02-15 DIAGNOSIS — E669 Obesity, unspecified: Secondary | ICD-10-CM

## 2022-02-15 DIAGNOSIS — Z6841 Body Mass Index (BMI) 40.0 and over, adult: Secondary | ICD-10-CM | POA: Diagnosis not present

## 2022-02-15 DIAGNOSIS — E88819 Insulin resistance, unspecified: Secondary | ICD-10-CM | POA: Diagnosis not present

## 2022-02-15 DIAGNOSIS — I10 Essential (primary) hypertension: Secondary | ICD-10-CM | POA: Diagnosis not present

## 2022-02-28 NOTE — Progress Notes (Signed)
Chief Complaint:   OBESITY Samantha [name] is here to discuss her progress with her obesity treatment plan along with follow-up of her obesity related diagnoses. Samantha [name] is on the Category 2 Plan and states she is following her eating plan approximately 90% of the time. Samantha [name] states she is doing cardio and weights for 60 minutes 6 times per week.  Today's visit was #: 2 Starting weight: 252 lbs Starting date: 01/25/2022 Today's weight: 247 lbs Today's date: 02/15/2022 Total lbs lost to date: 5 Total lbs lost since last in-office visit: 5  Interim History: Samantha [name] is doing well. She is requesting breakfast options. Reports adequate satiety and satiation. She has no abnormal cravings or side effects on GLP-1 agonist. She is exercising and following the meal plan.  BIA shows a reduction in body fat percentage to 46% from 48% muscle mass is preserved visceral fat rating has also decreased to 17 from 18.  Subjective:   1. Essential hypertension Samantha [name]'s blood pressure is well controlled, and she is on HCTZ.   2. Insulin resistance Samantha [name] has a new diagnosis. Her recent level was 22.7, optimal <7. May cause increased cravings. Will improve on GLP-1 and weight loss therapy. I discussed labs with the patient today.   3. Prediabetes Samantha [name]'s recent A1c was 6.3. I discussed labs with the patient today.   Assessment/Plan:   1. Essential hypertension Will monitor for orthostasis with ongoing weight loss. Maintain adequate hydration.   2. Insulin resistance Samantha [name] will continue with her weight loss therapy, she is reducing simple sugars from her diet and continue GLP-1 at current dose.   3. Prediabetes Samantha [name] will continue with her weight loss therapy and will continue GLP-1.  4. Obesity, current BMI 45.2 Samantha [name] is currently in the action stage of change. As such, her goal is to continue with weight loss efforts. She has agreed to the Category 2 Plan.   Patient was provided with breakfast options.    Samantha [name] will continue Wegovy at 1 mg to maintain weight loss rate of 1-2 lbs a week and avoid muscle loss and side effects.   Exercise goals: As is.   Behavioral modification strategies: increasing lean protein intake, increasing water intake, meal planning and cooking strategies, keeping healthy foods in the home, and planning for success.  Samantha [name] has agreed to follow-up with our clinic in 2 weeks. She was informed of the importance of frequent follow-up visits to maximize her success with intensive lifestyle modifications for her multiple health conditions.   Objective:   Blood pressure 128/87, pulse 60, temperature 97.7 F (36.5 C), height '5\' 2"'$  (1.575 m), weight 247 lb (112 kg), SpO2 100 %. Body mass index is 45.18 kg/m.  General: Cooperative, alert, well developed, in no acute distress. HEENT: Conjunctivae and lids unremarkable. Cardiovascular: Regular rhythm.  Lungs: Normal work of breathing. Neurologic: No focal deficits.   Lab Results  Component Value Date   CREATININE 0.87 11/11/2021   BUN 8 11/11/2021   NA 140 11/11/2021   K 3.9 11/11/2021   CL 100 11/11/2021   CO2 23 11/11/2021   Lab Results  Component Value Date   ALT 21 11/11/2021   AST 25 11/11/2021   ALKPHOS 81 11/11/2021   BILITOT 0.4 11/11/2021   No results found for: "HGBA1C" Lab Results  Component Value Date   INSULIN 22.7 02/07/2022   No results found for: "TSH" Lab Results  Component Value Date   CHOL 223 (H) 11/11/2021   HDL 53 11/11/2021  [place] 150 (H) 11/11/2021   TRIG 112 11/11/2021   CHOLHDL 4.2 11/11/2021   Lab Results  Component Value Date   VD25OH 41.6 02/07/2022   Lab Results  Component Value Date   WBC 6.0 02/07/2022   HGB 13.0 02/07/2022   HCT 39.1 02/07/2022   MCV 85 02/07/2022   PLT 320 02/07/2022   No results found for: "IRON", "TIBC", "FERRITIN"  Attestation Statements:   Reviewed by clinician on day of visit: allergies, medications, problem list, medical  history, surgical history, family history, social history, and previous encounter notes.  Time spent on visit including pre-visit chart review and post-visit care and charting was 40 minutes.   Wilhemena Durie, am acting as transcriptionist for Thomes Dinning, MD.  I have reviewed the above documentation for accuracy and completeness, and I agree with the above. -Thomes Dinning, MD

## 2022-03-01 ENCOUNTER — Ambulatory Visit (INDEPENDENT_AMBULATORY_CARE_PROVIDER_SITE_OTHER): Payer: BC Managed Care – PPO | Admitting: Internal Medicine

## 2022-03-01 ENCOUNTER — Encounter (INDEPENDENT_AMBULATORY_CARE_PROVIDER_SITE_OTHER): Payer: Self-pay | Admitting: Internal Medicine

## 2022-03-01 VITALS — BP 137/82 | HR 75 | Temp 98.2°F | Ht 62.0 in | Wt 246.0 lb

## 2022-03-01 DIAGNOSIS — Z6841 Body Mass Index (BMI) 40.0 and over, adult: Secondary | ICD-10-CM

## 2022-03-01 DIAGNOSIS — I10 Essential (primary) hypertension: Secondary | ICD-10-CM

## 2022-03-01 DIAGNOSIS — R7303 Prediabetes: Secondary | ICD-10-CM | POA: Diagnosis not present

## 2022-03-01 DIAGNOSIS — E669 Obesity, unspecified: Secondary | ICD-10-CM | POA: Diagnosis not present

## 2022-03-01 NOTE — Progress Notes (Unsigned)
Chief Complaint:   OBESITY Samantha [name] is here to discuss her progress with her obesity treatment plan along with follow-up of her obesity related diagnoses. Samantha [name] is on the Category 2 Plan and states she is following her eating plan approximately 40% of the time. Samantha [name] states she is using elliptical, bike, and weights 12 minutes 1 times per week.  Today's visit was #: 3 Starting weight: 252 lbs Starting date: 01/25/2022 Today's weight: 246 lbs Today's date: 03/01/2022 Total lbs lost to date: 6 Total lbs lost since last in-office visit: 1  Interim History: Samantha [name] presents today for follow-up.  She reports having a lapse over the last 10 days due to celebrations and working long hours.  She acknowledges not meal prepping which made it difficult to adhere to plan.  She is on Wegovy 1 mg a day and denies any adverse effects.  She reports adequate satiety and satiation.  She denies any abnormal cravings.  She is not interested in increasing medication.  Subjective:   1. Essential hypertension BP slightly above target today. Samantha [name] is on HCTZ.  2. Prediabetes Asymptomatic. She is on GLP-1 without side effects.  Assessment/Plan:   1. Essential hypertension Continue weight loss therapy. Monitor BP at home with arm cuff 21". If BP trends above 130/80, I recommend intensification of therapy.  2. Prediabetes Continue weight loss therapy and GLP-1 drug.  3. Obesity, current BMI 45 Samantha [name] is currently in the action stage of change. As such, her goal is to continue with weight loss efforts. She has agreed to the Category 2 Plan.   Exercise goals:  As is  Behavioral modification strategies: increasing lean protein intake, decreasing simple carbohydrates, meal planning and cooking strategies, and holiday eating strategies .  Samantha [name] has agreed to follow-up with our clinic in 2-3 weeks. She was informed of the importance of frequent follow-up visits to maximize her success with intensive lifestyle  modifications for her multiple health conditions.   Objective:   Blood pressure 137/82, pulse 75, temperature 98.2 F (36.8 C), height '5\' 2"'$  (1.575 m), weight 246 lb (111.6 kg), SpO2 98 %. Body mass index is 44.99 kg/m.  General: Cooperative, alert, well developed, in no acute distress. HEENT: Conjunctivae and lids unremarkable. Cardiovascular: Regular rhythm.  Lungs: Normal work of breathing. Neurologic: No focal deficits.   Lab Results  Component Value Date   CREATININE 0.87 11/11/2021   BUN 8 11/11/2021   NA 140 11/11/2021   K 3.9 11/11/2021   CL 100 11/11/2021   CO2 23 11/11/2021   Lab Results  Component Value Date   ALT 21 11/11/2021   AST 25 11/11/2021   ALKPHOS 81 11/11/2021   BILITOT 0.4 11/11/2021   No results found for: "HGBA1C" Lab Results  Component Value Date   INSULIN 22.7 02/07/2022   No results found for: "TSH" Lab Results  Component Value Date   CHOL 223 (H) 11/11/2021   HDL 53 11/11/2021   LDLCALC 150 (H) 11/11/2021   TRIG 112 11/11/2021   CHOLHDL 4.2 11/11/2021   Lab Results  Component Value Date   VD25OH 41.6 02/07/2022   Lab Results  Component Value Date   WBC 6.0 02/07/2022   HGB 13.0 02/07/2022   HCT 39.1 02/07/2022   MCV 85 02/07/2022   PLT 320 02/07/2022     Attestation Statements:   Reviewed by clinician on day of visit: allergies, medications, problem list, medical history, surgical history, family history, social history, and previous encounter notes.  Time spent on visit including pre-visit chart review and post-visit care and charting was 20 minutes.   I, Kathlene November, BS, CMA, am acting as transcriptionist for Thomes Dinning, MD.  I have reviewed the above documentation for accuracy and completeness, and I agree with the above. -Thomes Dinning, MD

## 2022-03-03 ENCOUNTER — Ambulatory Visit: Payer: BC Managed Care – PPO

## 2022-03-03 ENCOUNTER — Ambulatory Visit
Admission: RE | Admit: 2022-03-03 | Discharge: 2022-03-03 | Disposition: A | Discharge status: Home / Self Care | Payer: BC Managed Care – PPO | Source: Ambulatory Visit | Attending: Family Medicine | Admitting: Family Medicine

## 2022-03-03 DIAGNOSIS — N6341 Unspecified lump in right breast, subareolar: Secondary | ICD-10-CM

## 2022-03-03 DIAGNOSIS — N644 Mastodynia: Secondary | ICD-10-CM

## 2022-03-10 ENCOUNTER — Ambulatory Visit: Payer: BC Managed Care – PPO | Admitting: Orthopaedic Surgery

## 2022-03-10 VITALS — Ht 62.0 in | Wt 245.0 lb

## 2022-03-10 DIAGNOSIS — M1712 Unilateral primary osteoarthritis, left knee: Secondary | ICD-10-CM

## 2022-03-10 DIAGNOSIS — Z6841 Body Mass Index (BMI) 40.0 and over, adult: Secondary | ICD-10-CM | POA: Diagnosis not present

## 2022-03-10 MED ORDER — LIDOCAINE HCL 1 % IJ SOLN
2.0000 mL | INTRAMUSCULAR | Status: AC | PRN
  Administered 2022-03-10: 2 mL

## 2022-03-10 MED ORDER — BUPIVACAINE HCL 0.5 % IJ SOLN
2.0000 mL | INTRAMUSCULAR | Status: AC | PRN
  Administered 2022-03-10: 2 mL via INTRA_ARTICULAR

## 2022-03-10 MED ORDER — METHYLPREDNISOLONE ACETATE 40 MG/ML IJ SUSP
40.0000 mg | INTRAMUSCULAR | Status: AC | PRN
  Administered 2022-03-10: 40 mg via INTRA_ARTICULAR

## 2022-03-10 NOTE — Progress Notes (Signed)
Office Visit Note   Patient: Samantha [name]           Date of Birth: Jul 29, 1960           MRN: [id number] Visit Date: 03/10/2022              Requested by: Maurice Small, MD [address] [place],  [place] [address] PCP: Maurice Small, MD   Assessment & Plan: Visit Diagnoses:  1. Primary osteoarthritis of left knee   2. Body mass index 40.0-44.9, adult (HCC)     Plan: Impression is left knee osteoarthritis flare.  Cortisone injection repeated today.  She will continue to make all efforts at weight loss.  Follow-up as needed.  Follow-Up Instructions: No follow-ups on file.   Orders:  No orders of the defined types were placed in this encounter.  No orders of the defined types were placed in this encounter.     Procedures: Large Joint Inj: L knee on 03/10/2022 12:18 PM Details: 22 G needle Medications: 2 mL bupivacaine 0.5 %; 2 mL lidocaine 1 %; 40 mg methylPREDNISolone acetate 40 MG/ML Outcome: tolerated well, no immediate complications Patient was prepped and draped in the usual sterile fashion.       Clinical Data: No additional findings.   Subjective: Chief Complaint  Patient presents with   Left Knee - Pain    HPI Samantha [name] is returning today for recurrent left knee pain.  We saw her earlier this year in March for bilateral knee osteoarthritis.  She has been doing home exercises and the previous injections helped.  She has lost about 30 pounds.  She has been feeling medial sided knee pain.  Denies any swelling. Review of Systems   Objective: Vital Signs: Ht '5\' 2"'$  (1.575 m)   Wt 245 lb (111.1 kg)   BMI 44.81 kg/m   Physical Exam  Ortho Exam Exam [place] of left knee shows no joint effusion.  Medial joint line tenderness. Specialty Comments:  No specialty comments available.  Imaging: No results found.   PMFS History: Patient Active Problem List   Diagnosis Date Noted   Other fatigue 01/17/2022   SOB (shortness of breath)  01/17/2022   Depression screening 01/17/2022   OSA (obstructive sleep apnea) 11/11/2021   Pure hypercholesterolemia 08/11/2021   Snoring 08/11/2021   Bilateral primary osteoarthritis of knee 06/16/2021   Body mass index 45.0-49.9, adult ([place]) 06/16/2021   GERD (gastroesophageal reflux disease) 04/20/2019   Essential hypertension 04/20/2019   Bilateral hip pain 05/09/2018   Body mass index 40.0-44.9, adult ([place]) 05/09/2018   Morbid obesity ([place]) 05/09/2018   Chronic pain of both knees 08/17/2017   Past Medical History:  Diagnosis Date   ADD (attention deficit disorder)    Arthritis of both knees    Cancer ([place])    Colon Cancer adn polyps   Edema of both lower extremities    Essential hypertension 04/20/2019   GERD (gastroesophageal reflux disease) 04/20/2019   History of hysterectomy    patrial hysterectomy   Hyperlipidemia    Hypothyroidism (acquired)    Multiple thyroid nodules   Joint pain    OSA (obstructive sleep apnea) 11/11/2021   Pure hypercholesterolemia 08/11/2021   Snoring 08/11/2021   Thyroid disease    Hypothyroidism   Vertigo     Family History  Problem Relation Age of Onset   Thyroid disease Mother    Hearing loss Mother    Glaucoma Mother    Hyperlipidemia Mother  High Cholesterol Mother    Sleep apnea Mother    Colon cancer Maternal Grandmother     Past Surgical History:  Procedure Laterality Date   ABDOMINAL HYSTERECTOMY     CESAREAN SECTION     Social History   Occupational History   Occupation: Scientist, forensic  Tobacco Use   Smoking status: Never   Smokeless tobacco: Never  Substance and Sexual Activity   Alcohol use: No   Drug use: No   Sexual activity: Not on file

## 2022-03-14 ENCOUNTER — Ambulatory Visit (HOSPITAL_BASED_OUTPATIENT_CLINIC_OR_DEPARTMENT_OTHER): Payer: BC Managed Care – PPO | Admitting: Cardiovascular Disease

## 2022-03-21 ENCOUNTER — Other Ambulatory Visit (HOSPITAL_BASED_OUTPATIENT_CLINIC_OR_DEPARTMENT_OTHER): Payer: Self-pay

## 2022-03-22 ENCOUNTER — Other Ambulatory Visit (HOSPITAL_BASED_OUTPATIENT_CLINIC_OR_DEPARTMENT_OTHER): Payer: Self-pay

## 2022-03-23 ENCOUNTER — Ambulatory Visit (INDEPENDENT_AMBULATORY_CARE_PROVIDER_SITE_OTHER): Payer: BC Managed Care – PPO | Admitting: Internal Medicine

## 2022-03-23 ENCOUNTER — Encounter (INDEPENDENT_AMBULATORY_CARE_PROVIDER_SITE_OTHER): Payer: Self-pay | Admitting: Internal Medicine

## 2022-03-23 VITALS — BP 140/82 | HR 65 | Temp 98.0°F | Ht 62.0 in | Wt 245.0 lb

## 2022-03-23 DIAGNOSIS — R7303 Prediabetes: Secondary | ICD-10-CM | POA: Diagnosis not present

## 2022-03-23 DIAGNOSIS — I10 Essential (primary) hypertension: Secondary | ICD-10-CM

## 2022-03-23 DIAGNOSIS — E669 Obesity, unspecified: Secondary | ICD-10-CM

## 2022-03-23 DIAGNOSIS — Z6841 Body Mass Index (BMI) 40.0 and over, adult: Secondary | ICD-10-CM | POA: Diagnosis not present

## 2022-03-23 NOTE — Progress Notes (Signed)
Chief Complaint:   OBESITY Samantha [name] is here to discuss her progress with her obesity treatment plan along with follow-up of her obesity related diagnoses. Samantha [name] is on the Category 2 Plan and states she is following her eating plan approximately 30% of the time. Samantha [name] states she is doing 0 minutes 0 times per week.  Today's visit was #: 4 Starting weight: 252 lbs Starting date: 01/25/2022 Today's weight: 245 lbs Today's date: 03/23/2022 Total lbs lost to date: 7 Total lbs lost since last in-office visit: 1  Interim History: Samantha [name] presents today for follow-up since last office visit she is down 1 pound.  She reports suboptimal adherence to prescribed reduced calorie nutrition plan due to work and holidays.  She occasionally skips lunch.  She has been journaling.  She just considers herself inconsistent and finds the plan easy to follow.  She has been off Wegovy now for a little over a week because of supply issues.  She wants to try to come off medication to see if she could lose weight using a nutritional approach alone.  She would like ideas for breakfast as she is usually on the go.  [facility]  Subjective:   1. Prediabetes Most recent A1c was 6.3.  Patient is currently now off GLP-1 drug due to supply issues not because of side effects.  Patient counseled on condition.  2. Essential hypertension Blood pressure above target.  She is on hydrochlorothiazide.  Counseled on risk associated with elevated blood pressure.  GFR 75 in August 2023.  Normal potassium.  Assessment/Plan:   1. Prediabetes Weight loss therapy, patient counseled on the benefits of physical activity.  GLP-1 therapy on hold.  2. Essential hypertension Weight loss therapy, recommend monitoring blood pressure at home twice daily to look at trend.  She should follow-up with PCP if her blood pressures are above 130/80 for medication intensification.  Check renal  parameters if not completed in February for medication monitoring.  3. Obesity, current BMI 45 Pharmacotherapy: Patient is experiencing difficulty obtaining Wegovy.  She has been off the medication for a little over a week.  She is interested in not having to rely on medications even though it was explained to her that this is an adjunct and intended to be used for long-term management of obesity.  She would like to improve adherence to nutrition plan and see how she does.  Samantha [name] is currently in the action stage of change. As such, her goal is to continue with weight loss efforts. She has agreed to the Category 2 Plan.   Breakfast smoothie recipes were given.   Exercise goals: Start [employer], elliptical for 50 minutes.   Behavioral modification strategies: increasing lean protein intake, decreasing simple carbohydrates, no skipping meals, meal planning and cooking strategies, avoiding temptations, and planning for success.  Samantha [name] has agreed to follow-up with our clinic in 3 weeks. She was informed of the importance of frequent follow-up visits to maximize her success with intensive lifestyle modifications for her multiple health conditions.   Objective:   Blood pressure (!) 140/82, pulse 65, temperature 98 F (36.7 C), height '5\' 2"'$  (1.575 m), weight 245 lb (111.1 kg), SpO2 98 %. Body mass index is 44.81 kg/m.  General: Cooperative, alert, well developed, in no acute distress. HEENT: Conjunctivae and lids unremarkable. Cardiovascular: Regular rhythm.  Lungs: Normal work of breathing. Neurologic: No focal deficits.   Lab Results  Component Value  Date   CREATININE 0.87 11/11/2021   BUN 8 11/11/2021   NA 140 11/11/2021   K 3.9 11/11/2021   CL 100 11/11/2021   CO2 23 11/11/2021   Lab Results  Component Value Date   ALT 21 11/11/2021   AST 25 11/11/2021   ALKPHOS 81 11/11/2021   BILITOT 0.4 11/11/2021   No results found for: "HGBA1C" Lab Results  Component Value Date   INSULIN  22.7 02/07/2022   No results found for: "TSH" Lab Results  Component Value Date   CHOL 223 (H) 11/11/2021   HDL 53 11/11/2021   LDLCALC 150 (H) 11/11/2021   TRIG 112 11/11/2021   CHOLHDL 4.2 11/11/2021   Lab Results  Component Value Date   VD25OH 41.6 02/07/2022   Lab Results  Component Value Date   WBC 6.0 02/07/2022   HGB 13.0 02/07/2022   HCT 39.1 02/07/2022   MCV 85 02/07/2022   PLT 320 02/07/2022   No results found for: "IRON", "TIBC", "FERRITIN"  Attestation Statements:   Reviewed by clinician on day of visit: allergies, medications, problem list, medical history, surgical history, family history, social history, and previous encounter notes.   Wilhemena Durie, am acting as transcriptionist for Thomes Dinning, MD.  I have reviewed the above documentation for accuracy and completeness, and I agree with the above. -Thomes Dinning, MD

## 2022-03-27 ENCOUNTER — Encounter (HOSPITAL_BASED_OUTPATIENT_CLINIC_OR_DEPARTMENT_OTHER): Payer: Self-pay | Admitting: Cardiovascular Disease

## 2022-03-27 ENCOUNTER — Other Ambulatory Visit (HOSPITAL_BASED_OUTPATIENT_CLINIC_OR_DEPARTMENT_OTHER): Payer: Self-pay

## 2022-03-29 ENCOUNTER — Encounter (HOSPITAL_BASED_OUTPATIENT_CLINIC_OR_DEPARTMENT_OTHER): Payer: Self-pay | Admitting: Cardiovascular Disease

## 2022-03-29 ENCOUNTER — Ambulatory Visit (HOSPITAL_BASED_OUTPATIENT_CLINIC_OR_DEPARTMENT_OTHER): Payer: BC Managed Care – PPO | Admitting: Cardiovascular Disease

## 2022-03-29 VITALS — BP 128/66 | HR 70 | Ht 62.0 in | Wt 248.7 lb

## 2022-03-29 DIAGNOSIS — I10 Essential (primary) hypertension: Secondary | ICD-10-CM | POA: Diagnosis not present

## 2022-03-29 DIAGNOSIS — E7800 Pure hypercholesterolemia, unspecified: Secondary | ICD-10-CM | POA: Diagnosis not present

## 2022-03-29 DIAGNOSIS — R5383 Other fatigue: Secondary | ICD-10-CM | POA: Diagnosis not present

## 2022-03-29 DIAGNOSIS — G4733 Obstructive sleep apnea (adult) (pediatric): Secondary | ICD-10-CM | POA: Diagnosis not present

## 2022-03-29 NOTE — Patient Instructions (Signed)
Medication Instructions:  Your physician recommends that you continue on your current medications as directed. Please refer to the Current Medication list given to you today.  Labwork: HAVE REQUESTED FROM YOUR PRIMARY CARE  Testing/Procedures: NONE  Follow-Up: DR Claiborne Billings FOR CPAP FOLLOW UP  Your physician wants you to follow-up in: [place] will receive a reminder letter in the mail two months in advance. If you don't receive a letter, please call our office to schedule the follow-up appointment.  If you need a refill on your cardiac medications before your next appointment, please call your pharmacy.

## 2022-03-29 NOTE — Progress Notes (Signed)
Cardiology Office Note:    Date:  03/29/2022   ID:  Samantha [name], DOB [birth year], MRN [id number]  PCP:  Maurice Small, MD   [facility] Providers Cardiologist:  Skeet Latch, MD     Referring MD: Maurice Small, MD   CC:  Hyperlipidemia  History of Present Illness:    Samantha [name] is a 62 y.o. female with a hx of GERD, hypertension, hyperlipidemia, OSA on CPAP, and obesity who presents for follow up. She was initially seen in 2018 with atypical chest pain and shortness of breath.  She ws referred for an echo that was unremarkable.  She also had an ETT that showed poor exercise tolerance.  She was started on amlodipine. However, this was stopped due to increased lower extremity edema.  She has venous insufficiency and had laser treatment 03/24/18. She noted skin discoloration. She wears compression socks and follows up with Dr. Jones Skene at [facility]. Samantha [name] was started on HCTZ due to poorly controlled BP. She was previously on other statins and had a lot of stiffness.  She previously took Repatha and tolerated it well.  In Samantha interim it was discontinued and she was to start on Praluent. She never filled Samantha prescription. She has been tolerating pravastatin well.  Samantha [name] was referred to Samantha PREP program and health and wellness. She had a sleep study and was started on CPAP 09/2021.  At Samantha last visit her blood pressure was controlled in Samantha office but above goal at home.  She purchased a new machine.  She was referred to our Pharm.D. to start on semaglutide.  She continues working with Samantha healthy weight and wellness clinic.  Samantha [name] presents with concerns regarding her sleep apnea management and weight loss efforts. She reports using a CPAP machine but experiences inconsistent rest quality and admits to not understanding Samantha app readings associated with Samantha device.  She enjoys participating in Samantha healthy weight and wellness clinic and found that  [facility] was helpful.  She did hold it over Samantha holidays while she was sick.  She wonders if she should resume taking it.  Samantha [name] mentions her commitment to physical activity, holding a gym membership, and reports improvement in her knee arthritis following an injection from orthopedics. Samantha [name] has been self-monitoring her blood pressure, which has shown fluctuations. She reports December readings of approximately 123/77 and 124/77, with an increase in January to 128/77, 127/77, 135/77, and 140/77. Samantha [name] inquires about Samantha potential impact of coffee and pain medication on her blood pressure. She has been using ibuprofen for knee pain and is on an unspecified allergy medication.   Past Medical History:  Diagnosis Date   ADD (attention deficit disorder)    Arthritis of both knees    Cancer ([place])    Colon Cancer adn polyps   Edema of both lower extremities    Essential hypertension 04/20/2019   GERD (gastroesophageal reflux disease) 04/20/2019   History of hysterectomy    patrial hysterectomy   Hyperlipidemia    Hypothyroidism (acquired)    Multiple thyroid nodules   Joint pain    OSA (obstructive sleep apnea) 11/11/2021   Pure hypercholesterolemia 08/11/2021   Snoring 08/11/2021   Thyroid disease    Hypothyroidism   Vertigo     Past Surgical History:  Procedure Laterality Date   ABDOMINAL HYSTERECTOMY     CESAREAN SECTION      Current Medications: Current Meds  Medication Sig  acetaminophen (TYLENOL) 500 MG tablet 1 po q 4-6 hrs prn   Ascorbic Acid (VITAMIN C PO) Take by mouth.   aspirin EC 81 MG tablet Take 81 mg by mouth daily. Swallow whole.   CALCIUM PO Take by mouth.   Cholecalciferol (VITAMIN D-3) 125 MCG (5000 UT) TABS Take by mouth.   cyanocobalamin (VITAMIN B12) 1000 MCG tablet Take 1,000 mcg by mouth daily.   hydrochlorothiazide (HYDRODIURIL) 25 MG tablet Take 1 tablet (25 mg total) by mouth daily.   levothyroxine (SYNTHROID, LEVOTHROID) 88 MCG  tablet Take 88 mcg by mouth daily before breakfast.   Nutritional Supplements (VARICOSE VEINS FORMULA PO) Take 1,000 mg by mouth daily at 12 noon.   rosuvastatin (CRESTOR) 20 MG tablet Take 1 tablet (20 mg total) by mouth daily.   Semaglutide-Weight Management 0.5 MG/0.5ML SOAJ Inject 0.5 mg into Samantha skin once a week. ([name] taking differently: Inject 0.25 mg into Samantha skin once a week. Pt currently taking 0.25 mg weekly)   Semaglutide-Weight Management 1 MG/0.5ML SOAJ Inject 1 mg into Samantha skin once a week.   Semaglutide-Weight Management 1.7 MG/0.75ML SOAJ Inject 1.7 mg into Samantha skin once a week.   Semaglutide-Weight Management 2.4 MG/0.75ML SOAJ Inject 2.4 mg into Samantha skin once a week.   TURMERIC PO Take by mouth.   vitamin E 180 MG (400 UNITS) capsule Take 400 Units by mouth daily.   Current Facility-Administered Medications for Samantha 03/29/22 encounter (Office Visit) with Skeet Latch, MD  Medication   methylPREDNISolone acetate (DEPO-MEDROL) injection 40 mg     Allergies:   [name] has no known allergies.   Social History   Socioeconomic History   Marital status: Single    Spouse name: Not on file   Number of children: Not on file   Years of education: Not on file   Highest education level: Not on file  Occupational History   Occupation: Scientist, forensic  Tobacco Use   Smoking status: Never   Smokeless tobacco: Never  Substance and Sexual Activity   Alcohol use: No   Drug use: No   Sexual activity: Not on file  Other Topics Concern   Not on file  Social History Narrative   ** Merged History Encounter **       Social Determinants of Health   Financial Resource Strain: Not on file  Food Insecurity: Not on file  Transportation Needs: Not on file  Physical Activity: Not on file  Stress: Not on file  Social Connections: Not on file     Family History: Samantha [name]'s family history includes Colon cancer in her maternal grandmother; Glaucoma in her  mother; Hearing loss in her mother; High Cholesterol in her mother; Hyperlipidemia in her mother; Sleep apnea in her mother; Thyroid disease in her mother.  ROS:   Please see Samantha history of present illness.    (+) Bilateral knee pain  All other systems reviewed and are negative.  EKGs/Labs/Other Studies Reviewed:    Samantha following studies were reviewed today:  03/16/2017  Exercise Tolerance Test: Blood pressure demonstrated a hypertensive response to exercise. There was no ST segment deviation noted during stress.   Poor exercise capacity. Hypertensive response to exertion (210/75 mmHg). No ischemia.   06/26/2016  Echocardiogram: Study Conclusions   - Left ventricle: Samantha cavity size was normal. Systolic function was    normal. Samantha estimated ejection fraction was in Samantha range of 55%    to 60%. Wall motion was normal; there were no  regional wall    motion abnormalities. Left ventricular diastolic function    parameters were normal.    EKG: EKG is personally reviewed. 11/11/21: EKG was not ordered.   08/11/2021: Sinus rhythm.  Rate 65 bpm.  PACs. 10/13/2019: Sinus rhythm.  Rate 64 bpm.  PACs. 04/02/2019: Sinus rhythm.  Rate 67 bpm.  Nonspecific ST T changes.  Recent Labs: 11/11/2021: ALT 21; BUN 8; Creatinine, Ser 0.87; Potassium 3.9; Sodium 140 02/07/2022: Hemoglobin 13.0; Platelets 320   Recent Lipid Panel    Component Value Date/Time   CHOL 223 (H) 11/11/2021 0932   TRIG 112 11/11/2021 0932   HDL 53 11/11/2021 0932   CHOLHDL 4.2 11/11/2021 0932   LDLCALC 150 (H) 11/11/2021 0932    01/09/2022: Total cholesterol 146, triglycerides 88, HDL 44, LDL 85  Physical Exam:    Wt Readings from Last 3 Encounters:  03/29/22 248 lb 11.2 oz (112.8 kg)  03/23/22 245 lb (111.1 kg)  03/10/22 245 lb (111.1 kg)     VS:  BP 128/66 (BP Location: Left Arm, [name] Position: Sitting, Cuff Size: Large)   Pulse 70   Ht '5\' 2"'$  (1.575 m)   Wt 248 lb 11.2 oz (112.8 kg)   SpO2 97%   BMI  45.49 kg/m  , BMI Body mass index is 45.49 kg/m. GENERAL:  Well appearing HEENT: Pupils equal round and reactive, fundi not visualized, oral mucosa unremarkable NECK:  No jugular venous distention, waveform within normal limits, carotid upstroke brisk and symmetric, no bruits, no thyromegaly LUNGS:  Clear to auscultation bilaterally HEART:  RRR.  PMI not displaced or sustained,S1 and S2 within normal limits, no S3, no S4, no clicks, no rubs, no murmurs ABD:  Flat, positive bowel sounds normal in frequency in pitch, no bruits, no rebound, no guarding, no midline pulsatile mass, no hepatomegaly, no splenomegaly EXT:  2 plus pulses throughout, no edema, no cyanosis no clubbing SKIN:  No rashes no nodules NEURO:  Cranial nerves II through XII grossly intact, motor grossly intact throughout PSYCH:  Cognitively intact, oriented to person place and time   ASSESSMENT:    1. Essential hypertension   2. OSA (obstructive sleep apnea)   3. Pure hypercholesterolemia   4. Other fatigue      PLAN:    No problem-specific Assessment & Plan notes found for this encounter.  # Obstructive Sleep Apnea - [name] has been using CPAP and reports mixed results in terms of restfulness. Encourage [name] to follow up with Dr. Claiborne Billings for sleep medicine evaluation and to better understand CPAP readings.  # Weight management # Morbid obesity - [name] has been attending a weight loss center and using Weygovy.  Recommend that she resume Samantha [facility] at her previous dose. Encouraged her to keep working on diet and exercise.  # Osteoarthritis of Samantha knee - [name] received a corticosteroid injection, which has provided relief. Advise [name] to continue with injections as needed, but not more than four times a year. Recommend using Tylenol for pain management instead of Ibuprofen to avoid potential blood pressure elevation.  # Hypertension - [name] reports fluctuating blood pressure readings, with some values  above Samantha target range of <130/80.  Continue working on diet and exercise and limiting sodium intake.  Reduce NSAID use as above.  Reduce caffeine use as above.  Continue HCTZ.  # Hyperlipidemia Lipids were much better controlled when she checked with her PCP in October.  Continue rosuvastatin   Disposition: FU with Amberlin Utke C. Oval Linsey, MD,  FACC in 1 year.  Medication Adjustments/Labs and Tests Ordered: Current medicines are reviewed at length with Samantha [name] today.  Concerns regarding medicines are outlined above.   No orders of Samantha defined types were placed in this encounter.  [name] Instructions  Medication Instructions:  Your physician recommends that you continue on your current medications as directed. Please refer to Samantha Current Medication list given to you today.  Labwork: HAVE REQUESTED FROM YOUR PRIMARY CARE  Testing/Procedures: NONE  Follow-Up: DR Claiborne Billings FOR CPAP FOLLOW UP  Your physician wants you to follow-up in: [place] will receive a reminder letter in Samantha mail two months in advance. If you don't receive a letter, please call our office to schedule Samantha follow-up appointment.  If you need a refill on your cardiac medications before your next appointment, please call your pharmacy.      Signed, Skeet Latch, MD  03/29/2022 9:55 AM    [place]

## 2022-03-30 ENCOUNTER — Encounter (HOSPITAL_BASED_OUTPATIENT_CLINIC_OR_DEPARTMENT_OTHER): Payer: Self-pay

## 2022-04-13 ENCOUNTER — Encounter (INDEPENDENT_AMBULATORY_CARE_PROVIDER_SITE_OTHER): Payer: Self-pay | Admitting: Internal Medicine

## 2022-04-13 ENCOUNTER — Ambulatory Visit (INDEPENDENT_AMBULATORY_CARE_PROVIDER_SITE_OTHER): Payer: BC Managed Care – PPO | Admitting: Internal Medicine

## 2022-04-13 VITALS — BP 116/73 | HR 72 | Temp 98.4°F | Ht 62.0 in | Wt 246.0 lb

## 2022-04-13 DIAGNOSIS — Z6841 Body Mass Index (BMI) 40.0 and over, adult: Secondary | ICD-10-CM

## 2022-04-13 DIAGNOSIS — G4733 Obstructive sleep apnea (adult) (pediatric): Secondary | ICD-10-CM | POA: Diagnosis not present

## 2022-04-13 DIAGNOSIS — E7800 Pure hypercholesterolemia, unspecified: Secondary | ICD-10-CM | POA: Diagnosis not present

## 2022-04-13 DIAGNOSIS — E66813 Obesity, class 3: Secondary | ICD-10-CM

## 2022-04-13 DIAGNOSIS — R7303 Prediabetes: Secondary | ICD-10-CM | POA: Diagnosis not present

## 2022-04-13 DIAGNOSIS — I10 Essential (primary) hypertension: Secondary | ICD-10-CM

## 2022-04-13 DIAGNOSIS — E669 Obesity, unspecified: Secondary | ICD-10-CM

## 2022-04-13 NOTE — Assessment & Plan Note (Signed)
On CPAP with reported good compliance. Continue PAP therapy. Weight loss of 15% or more may improve AHI.    

## 2022-04-13 NOTE — Assessment & Plan Note (Signed)
Blood pressure at goal for age and risk category.  On hydrochlorothiazide without adverse effects.  Most recent renal parameters reviewed which showed normal electrolytes and kidney function.  Continue with weight loss therapy.  Monitor for symptoms of orthostasis while losing weight. Continue current regimen and home monitoring for a goal blood pressure of 120/80.  

## 2022-04-13 NOTE — Progress Notes (Signed)
Office: [phone]  /  Fax: [phone]  WEIGHT SUMMARY AND BIOMETRICS  Medical Weight Loss Height: '5\' 2"'$  (1.575 m) Weight: 246 lb (111.6 kg) Temp: 98.4 F (36.9 C) Pulse Rate: 72 BP: 116/73 SpO2: 97 % Fasting: no Labs: no Today's Visit #: 5 Weight at Last VIsit: 245 lb Weight Lost Since Last Visit: 0 lb  Body Fat %: 49.4 % Fat Mass (lbs): 121.6 lbs Muscle Mass (lbs): 118.2 lbs Total Body Water (lbs): 89.4 lbs Visceral Fat Rating : 18 Peak Weight: 275 lb Starting Date: 01/25/22 Starting Weight: 252 lb Total Weight Loss (lbs): 6 lb (2.722 kg)    HPI  Chief Complaint: OBESITY  Samantha [name] is here to discuss her progress with her obesity treatment plan. She is on the the Category 2 Plan and states she is following her eating plan approximately 50 % of the time. She states she is not exercising.   Interval History: Since last office she reports increased levels of stress at work which resulted in some emotional eating.  She also notes increased social activities without healthy food options.  She has decided to resume treatment with [facility] and is currently on 1 mg once a week.  This was prescribed by her PCP.  She denies any adverse effects.    Pharmacotherapy: wegovy 1 mg weekly  PHYSICAL EXAM:  Blood pressure 116/73, pulse 72, temperature 98.4 F (36.9 C), height '5\' 2"'$  (1.575 m), SpO2 97 %. Body mass index is 45.49 kg/m.  General: She is overweight, cooperative, alert, well developed, and in no acute distress. PSYCH: Has normal mood, affect and thought process.   HEENT: EOMI, sclerae are anicteric. Lungs: Normal breathing effort, no conversational dyspnea. Extremities: No edema.  Neurologic: No gross sensory or motor deficits. No tremors or fasciculations noted.    DIAGNOSTIC DATA REVIEWED:  BMET    Component Value Date/Time   NA 140 11/11/2021 0932   K 3.9 11/11/2021 0932   CL 100 11/11/2021 0932   CO2 23 11/11/2021 0932   GLUCOSE 111 (H) 11/11/2021 0932    BUN 8 11/11/2021 0932   CREATININE 0.87 11/11/2021 0932   CALCIUM 9.3 11/11/2021 0932   GFRNONAA 64 08/04/2019 0855   GFRAA 74 08/04/2019 0855   No results found for: "HGBA1C" Lab Results  Component Value Date   INSULIN 22.7 02/07/2022   CBC    Component Value Date/Time   WBC 6.0 02/07/2022 1157   RBC 4.59 02/07/2022 1157   HGB 13.0 02/07/2022 1157   HCT 39.1 02/07/2022 1157   PLT 320 02/07/2022 1157   MCV 85 02/07/2022 1157   MCH 28.3 02/07/2022 1157   MCHC 33.2 02/07/2022 1157   RDW 13.5 02/07/2022 1157   Iron/TIBC/Ferritin/ %Sat No results found for: "IRON", "TIBC", "FERRITIN", "IRONPCTSAT" Lipid Panel     Component Value Date/Time   CHOL 223 (H) 11/11/2021 0932   TRIG 112 11/11/2021 0932   HDL 53 11/11/2021 0932   CHOLHDL 4.2 11/11/2021 0932   LDLCALC 150 (H) 11/11/2021 0932   Hepatic Function Panel     Component Value Date/Time   PROT 6.9 11/11/2021 0932   ALBUMIN 4.2 11/11/2021 0932   AST 25 11/11/2021 0932   ALT 21 11/11/2021 0932   ALKPHOS 81 11/11/2021 0932   BILITOT 0.4 11/11/2021 0932   BILIDIR 0.10 08/09/2017 0918   No results found for: "TSH"   ASSESSMENT AND PLAN  TREATMENT PLAN FOR OBESITY:  Recommended Dietary Goals  Samantha [name] is currently in the action stage  of change. As such, her goal is to continue with weight loss efforts. She has agreed to the Category 2 Plan.  Behavioral Intervention  We discussed the following Behavioral Modification Strategies today: increasing lean protein intake, no skipping meals, meal planning and cooking strategies, emotional eating strategies, avoiding temptations, and planning for success.  Additional resources provided today: NA  Recommended Physical Activity Goals  Samantha [name] has been instructed to work up to 150 minutes of moderate intensity aerobic activity a week and strengthening exercises 2-3 times per week for cardiovascular health, weight loss maintenance and preservation of muscle mass.   She has  agreed to increase physical activity in their day and reduce sedentary time (increase NEAT).    Pharmacotherapy We discussed various medication options to help Samantha [name] with her weight loss efforts and we both agreed to continue Wegovy 1 mg once a week as prescribed her PCP to aid patient in her weight loss efforts.  ASSOCIATED CONDITIONS ADDRESSED TODAY  Prediabetes Assessment & Plan: Most recent A1c is 6.3 with associated elevated insulin levels.  Patient informed of disease state and risk of progression. This may contribute to abnormal cravings, fatigue and diabetes complications without having diabetes.   We reviewed treatment options which include weight loss of about 7 to 10% of body weight, increasing physical activity to 150 minutes a week of moderate intensity and pharmacotherapy with incretin mimetic.  Continue Wegovy at 1 mg once weekly    Essential hypertension Assessment & Plan: Blood pressure at goal for age and risk category.  On hydrochlorothiazide without adverse effects.  Most recent renal parameters reviewed which showed normal electrolytes and kidney function.  Continue with weight loss therapy.  Monitor for symptoms of orthostasis while losing weight. Continue current regimen and home monitoring for a goal blood pressure of 120/80.    Obesity, current BMI 45  OSA (obstructive sleep apnea) Assessment & Plan: On CPAP with reported good compliance. Continue PAP therapy. Weight loss of 15% or more may improve AHI.      Pure hypercholesterolemia Assessment & Plan: Her most recent LDL was 150 she has a total cholesterol of 223. The 10-year ASCVD risk score (Arnett DK, et al., 2019) is: 7.1%.  She is on high intensity statin therapy with Crestor 20 mg a day without any side effects.  I recommend checking fasting lipid panel in 2 months to check on response.       Return in about 2 weeks (around 04/27/2022) for For Weight Mangement with Dr. Gerarda [name].Marland Kitchen She was informed  of the importance of frequent follow up visits to maximize her success with intensive lifestyle modifications for her multiple health conditions.   ATTESTASTION STATEMENTS:  Reviewed by clinician on day of visit: allergies, medications, problem list, medical history, surgical history, family history, social history, and previous encounter notes.   Time spent on visit including pre-visit chart review and post-visit care and charting was 30 minutes.    Thomes Dinning, MD

## 2022-04-13 NOTE — Assessment & Plan Note (Signed)
Her most recent LDL was 150 she has a total cholesterol of 223. The 10-year ASCVD risk score (Arnett DK, et al., 2019) is: 7.1%.  She is on high intensity statin therapy with Crestor 20 mg a day without any side effects.  I recommend checking fasting lipid panel in 2 months to check on response.

## 2022-04-13 NOTE — Assessment & Plan Note (Signed)
Most recent A1c is 6.3 with associated elevated insulin levels.  Patient informed of disease state and risk of progression. This may contribute to abnormal cravings, fatigue and diabetes complications without having diabetes.   We reviewed treatment options which include weight loss of about 7 to 10% of body weight, increasing physical activity to 150 minutes a week of moderate intensity and pharmacotherapy with incretin mimetic.  Continue Wegovy at 1 mg once weekly

## 2022-04-26 ENCOUNTER — Ambulatory Visit: Payer: BC Managed Care – PPO | Admitting: Cardiovascular Disease

## 2022-04-26 ENCOUNTER — Encounter: Payer: Self-pay | Admitting: Cardiovascular Disease

## 2022-04-26 ENCOUNTER — Ambulatory Visit
Admission: RE | Admit: 2022-04-26 | Discharge: 2022-04-26 | Disposition: A | Discharge status: Home / Self Care | Payer: BC Managed Care – PPO | Source: Ambulatory Visit | Attending: Cardiovascular Disease | Admitting: Cardiovascular Disease

## 2022-04-26 ENCOUNTER — Other Ambulatory Visit (HOSPITAL_COMMUNITY): Payer: Self-pay | Admitting: Family Medicine

## 2022-04-26 VITALS — BP 124/64 | HR 75 | Ht 63.0 in | Wt 246.6 lb

## 2022-04-26 DIAGNOSIS — K219 Gastro-esophageal reflux disease without esophagitis: Secondary | ICD-10-CM

## 2022-04-26 DIAGNOSIS — G4733 Obstructive sleep apnea (adult) (pediatric): Secondary | ICD-10-CM | POA: Diagnosis not present

## 2022-04-26 DIAGNOSIS — M7989 Other specified soft tissue disorders: Secondary | ICD-10-CM

## 2022-04-26 DIAGNOSIS — I10 Essential (primary) hypertension: Secondary | ICD-10-CM | POA: Insufficient documentation

## 2022-04-26 DIAGNOSIS — E7800 Pure hypercholesterolemia, unspecified: Secondary | ICD-10-CM

## 2022-04-26 DIAGNOSIS — R0683 Snoring: Secondary | ICD-10-CM

## 2022-04-26 DIAGNOSIS — G4734 Idiopathic sleep related nonobstructive alveolar hypoventilation: Secondary | ICD-10-CM | POA: Insufficient documentation

## 2022-04-26 NOTE — Patient Instructions (Addendum)
Medication Instructions:  Your physician recommends that you continue on your current medications as directed. Please refer to the Current Medication list given to you today.  *If you need a refill on your cardiac medications before your next appointment, please call your pharmacy*   Lab Work: None   Testing/Procedures: None   Follow-Up: At [facility], you and your health needs are our priority.  As part of our continuing mission to provide you with exceptional heart care, we have created designated Provider Care Teams.  These Care Teams include your primary Cardiologist (physician) and Advanced Practice Providers (APPs -  Physician Assistants and Nurse Practitioners) who all work together to provide you with the care you need, when you need it.   Your next appointment:   1 year(s)  Provider:   Shelva Majestic, MD

## 2022-04-26 NOTE — Progress Notes (Signed)
Cardiology Office Note    Date:  04/26/2022   ID:  Samantha [name], Samantha [name] 04/10/1960, MRN [id number]  PCP:  Maurice Small, MD  Cardiologist:  Shelva Majestic, MD (sleep); Dr. Oval Linsey  New sleep consultation and evaluation referred by Dr. Oval Linsey following initiation of CPAP therapy.   History of Present Illness:  Samantha [name] is a 62 y.o. female who is followed by Dr. Skeet Latch for cardiology care.  She has a history of hypertension, GERD, hyperlipidemia, and was recently found to have obstructive sleep apnea.  She was referred for a sleep study which was in lab and she met criteria for split-night protocol.  On the diagnostic portion of the study, AHI was 13.3/h, RDI 14.5/h, but during RAM sleep sleep apnea was very severe at 67.9/h.  She had significant oxygen desaturation to a nadir of 69%.  On the titration portion of the study she was titrated up to 13 cmH2O AHI was 4.5/h, R DI 6.0/h and O2 nadir at 89%.  He was recommended that she initiate AutoPap therapy with an EPR of 3 at 13 to 20 cm of water.  During the titration study she had used a ResMed AirFit F20 medium size mask.  Ms. Jeangilles received a new ResMed AirSense 11 AutoSet unit on March 06, 2022 with [employer] as her DME company.  On her initial download from December 18 through April 04, 2022 she has met compliance standards with usage at 97% ofdays and usage greater than 4 hours at 87%.  She has required high pressure and her 95th percentile pressure was 18.5 with maximum average pressure at 19.3.  Although she met compliance standards greater than 4 hours, sleep duration is still suboptimal at only 5 hours and 38 minutes.  AHI is 3.1.  Her most recent download from January 8 to April 25, 2022 was obtained.  However she has not used therapy since January 25 due to respiratory infection and consequently has not met compliance.  She continues to have high pressure requirements with 95th percentile pressure 18.1  maximum average pressure 19.1 with AHI 2.6.  Since initiating CPAP, she believes she has felt better.  Previously she was unable to sleep on her back and would have witnessed apneic events as well as snoring and nonrestorative sleep.  She has been using an F20 fullface mask and at times has had some nasal irritation.  An Epworth Sleepiness Scale score was calculated in the office today and this endorsed at 3 arguing against residual daytime sleepiness.  Presently, she is on hydrochlorothiazide 25 mg for blood pressure control.  She is on rosuvastatin 20 mg for hyperlipidemia and levothyroxine 88 mcg for hypothyroidism.  She is on [facility] for weight loss with a peak weight of 275 and over the past 7 months her weight has been reduced to 46.  She states she has a weight goal of approximately 150 pounds.  She presents for her initial sleep consultation and evaluation.   Past Medical History:  Diagnosis Date   ADD (attention deficit disorder)    Arthritis of both knees    Cancer ([place])    Colon Cancer adn polyps   Edema of both lower extremities    Essential hypertension 04/20/2019   GERD (gastroesophageal reflux disease) 04/20/2019   History of hysterectomy    patrial hysterectomy   Hyperlipidemia    Hypothyroidism (acquired)    Multiple thyroid nodules   Joint pain    OSA (  obstructive sleep apnea) 11/11/2021   Pure hypercholesterolemia 08/11/2021   Snoring 08/11/2021   Thyroid disease    Hypothyroidism   Vertigo     Past Surgical History:  Procedure Laterality Date   ABDOMINAL HYSTERECTOMY     CESAREAN SECTION      Current Medications: Outpatient Medications Prior to Visit  Medication Sig Dispense Refill   acetaminophen (TYLENOL) 500 MG tablet 1 po q 4-6 hrs prn 60 tablet 0   Ascorbic Acid (VITAMIN C PO) Take by mouth.     aspirin EC 81 MG tablet Take 81 mg by mouth daily. Swallow whole.     CALCIUM PO Take by mouth.     Cholecalciferol (VITAMIN D-3) 125 MCG (5000 UT) TABS Take  by mouth.     cyanocobalamin (VITAMIN B12) 1000 MCG tablet Take 1,000 mcg by mouth daily.     fluticasone (FLONASE) 50 MCG/ACT nasal spray Place 1 spray into both nostrils daily.     hydrochlorothiazide (HYDRODIURIL) 25 MG tablet Take 1 tablet (25 mg total) by mouth daily. 87 tablet 1   levothyroxine (SYNTHROID, LEVOTHROID) 88 MCG tablet Take 88 mcg by mouth daily before breakfast.     Nutritional Supplements (VARICOSE VEINS FORMULA PO) Take 1,000 mg by mouth daily at 12 noon.     rosuvastatin (CRESTOR) 20 MG tablet Take 1 tablet (20 mg total) by mouth daily. 90 tablet 3   Semaglutide-Weight Management 0.5 MG/0.5ML SOAJ Inject 0.5 mg into the skin once a week. 2 mL 1   Semaglutide-Weight Management 1 MG/0.5ML SOAJ Inject 1 mg into the skin once a week. 2 mL 1   Semaglutide-Weight Management 1.7 MG/0.75ML SOAJ Inject 1.7 mg into the skin once a week. 3 mL 1   Semaglutide-Weight Management 2.4 MG/0.75ML SOAJ Inject 2.4 mg into the skin once a week. 3 mL 1   TURMERIC PO Take by mouth.     vitamin E 180 MG (400 UNITS) capsule Take 400 Units by mouth daily. (Patient not taking: Reported on 04/26/2022)     Facility-Administered Medications Prior to Visit  Medication Dose Route Frequency Provider Last Rate Last Admin   methylPREDNISolone acetate (DEPO-MEDROL) injection 40 mg  40 mg Intra-articular Once Hilts, Michael, MD         Allergies:   Patient has no known allergies.   Social History   Socioeconomic History   Marital status: Single    Spouse name: Not on file   Number of children: Not on file   Years of education: Not on file   Highest education level: Not on file  Occupational History   Occupation: Scientist, forensic  Tobacco Use   Smoking status: Never   Smokeless tobacco: Never  Substance and Sexual Activity   Alcohol use: No   Drug use: No   Sexual activity: Not on file  Other Topics Concern   Not on file  Social History Narrative   ** Merged History Encounter **        Social Determinants of Health   Financial Resource Strain: Not on file  Food Insecurity: Not on file  Transportation Needs: Not on file  Physical Activity: Not on file  Stress: Not on file  Social Connections: Not on file    Socially, she was born in [place].  She has lived in [place] for 17 years.  She is divorced.  She has 1 son age 8 and 4 grand daughter age 17.  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  Family History:  The patient's family history includes Colon cancer in her maternal grandmother; Glaucoma in her mother; Hearing loss in her mother; High Cholesterol in her mother; Hyperlipidemia in her mother; Sleep apnea in her mother; Thyroid disease in her mother.  Her father died of myocardial infarction at age 45.  Mother is living in her 1s and has obstructive sleep apnea but does not use treatment.  She has no siblings.  ROS General: Negative; No fevers, chills, or night sweats;  HEENT: Negative; No changes in vision or hearing, sinus congestion, difficulty swallowing Pulmonary: Negative; No cough, wheezing, shortness of breath, hemoptysis Cardiovascular: Negative; No chest pain, presyncope, syncope, palpitations GI: Negative; No nausea, vomiting, diarrhea, or abdominal pain GU: Negative; No dysuria, hematuria, or difficulty voiding Musculoskeletal: Negative; no myalgias, joint pain, or weakness Hematologic/Oncology: Negative; no easy bruising, bleeding Endocrine: Negative; no heat/cold intolerance; no diabetes Neuro: Negative; no changes in balance, headaches Skin: Negative; No rashes or skin lesions Psychiatric: Negative; No behavioral problems, depression Sleep: See HPI.  OSA with recent CPAP set up date March 06, 2022.  Unaware of residual snoring.  No bruxism.  No restless legs.  No hypnopompic or hypnagogic hallucinations or cataplectic events.   Epworth Sleepiness Scale was calculated in the office today and this  endorsed at 3 as shown below:  Epworth Sleepiness Scale: Situation   Chance of Dozing/Sleeping (0 = never , 1 = slight chance , 2 = moderate chance , 3 = high chance )   sitting and reading 1   watching TV 1   sitting inactive in a public place 0   being a passenger in a motor vehicle for an hour or more 0   lying down in the afternoon 1   sitting and talking to someone 0   sitting quietly after lunch (no alcohol) 0   while stopped for a few minutes in traffic as the driver 0   Total Score  3    Other comprehensive 14 point system review is negative.   PHYSICAL EXAM:   VS:  BP 124/64   Pulse 75   Ht '5\' 3"'$  (1.6 m)   Wt 246 lb 9.6 oz (111.9 kg)   SpO2 98%   BMI 43.68 kg/m     Repeat blood pressure by me was 118/66  Wt Readings from Last 3 Encounters:  04/26/22 246 lb 9.6 oz (111.9 kg)  04/13/22 246 lb (111.6 kg)  03/29/22 248 lb 11.2 oz (112.8 kg)    General: Alert, oriented, no distress.  Skin: normal turgor, no rashes, warm and dry HEENT: Normocephalic, atraumatic. Pupils equal round and reactive to light; sclera anicteric; extraocular muscles intact; Fundi ** Nose without nasal septal hypertrophy Mouth/Parynx: Large tongue; Mallinpatti scale 4 Neck: No JVD, no carotid bruits; normal carotid upstroke Lungs: clear to ausculatation and percussion; no wheezing or rales Chest wall: without tenderness to palpitation Heart: PMI not displaced, RRR, s1 s2 normal, 1/6 systolic murmur, no diastolic murmur, no rubs, gallops, thrills, or heaves Abdomen: soft, nontender; no hepatosplenomehaly, BS+; abdominal aorta nontender and not dilated by palpation. Back: no CVA tenderness Pulses 2+ Musculoskeletal: full range of motion, normal strength, no joint deformities Extremities: no clubbing cyanosis or edema, Homan's sign negative  Neurologic: grossly nonfocal; Cranial nerves grossly wnl Psychologic: Normal mood and affect   Studies/Labs Reviewed:   I personally reviewed the ECG  from Aug 11, 2021 which shows normal sinus rhythm at 65 bpm.  Nonspecific T wave  change in lead III.  Recent Labs:    Latest Ref Rng & Units 11/11/2021    9:32 AM 08/04/2019    8:55 AM  BMP  Glucose 70 - 99 mg/dL 111  117   BUN 8 - 27 mg/dL 8  12   Creatinine 0.57 - 1.00 mg/dL 0.87  0.96   BUN/Creat Ratio 12 - '28 9  13   '$ Sodium 134 - 144 mmol/L 140  138   Potassium 3.5 - 5.2 mmol/L 3.9  4.3   Chloride 96 - 106 mmol/L 100  100   CO2 20 - 29 mmol/L 23  25   Calcium 8.7 - 10.3 mg/dL 9.3  9.1         Latest Ref Rng & Units 11/11/2021    9:32 AM 08/04/2019    8:55 AM 08/09/2017    9:18 AM  Hepatic Function  Total Protein 6.0 - 8.5 g/dL 6.9  6.6  6.4   Albumin 3.9 - 4.9 g/dL 4.2  4.0  3.8   AST 0 - 40 IU/L '25  19  18   '$ ALT 0 - 32 IU/L '21  20  20   '$ Alk Phosphatase 44 - 121 IU/L 81  87  80   Total Bilirubin 0.0 - 1.2 mg/dL 0.4  0.2  0.2   Bilirubin, Direct 0.00 - 0.40 mg/dL   0.10        Latest Ref Rng & Units 02/07/2022   11:57 AM  CBC  WBC 3.4 - 10.8 x10E3/uL 6.0   Hemoglobin 11.1 - 15.9 g/dL 13.0   Hematocrit 34.0 - 46.6 % 39.1   Platelets 150 - 450 x10E3/uL 320    Lab Results  Component Value Date   MCV 85 02/07/2022   No results found for: "TSH" No results found for: "HGBA1C"   BNP No results found for: "BNP"  ProBNP No results found for: "PROBNP"   Lipid Panel     Component Value Date/Time   CHOL 223 (H) 11/11/2021 0932   TRIG 112 11/11/2021 0932   HDL 53 11/11/2021 0932   CHOLHDL 4.2 11/11/2021 0932   LDLCALC 150 (H) 11/11/2021 0932   LABVLDL 20 11/11/2021 0932     RADIOLOGY: VAS [place] LOWER EXTREMITY VENOUS (DVT)  Result Date: 04/26/2022  Lower Venous DVT Study Patient Name:  TIERRIA WATSON [facility]  Date of Exam:   04/26/2022 Medical Rec #: [id number]            Accession #:    [id number] Date of Birth: 1960/03/25            Patient Gender: F Patient Age:   37 years Exam Location:  [facility] Procedure:      VAS [place] LOWER EXTREMITY VENOUS (DVT) Referring Phys:  ROMANA HAYES --------------------------------------------------------------------------------  Indications: Acute onset of swelling and pain from posterior knee to calf x 4 days. Patient denies any unusual SOB.  Risk Factors: None identified. Comparison Study: NA Performing Technologist: Sharlett Iles RVT  Examination Guidelines: A complete evaluation includes B-mode imaging, spectral Doppler, color Doppler, and power Doppler as needed of all accessible portions of each vessel. Bilateral testing is considered an integral part of a complete examination. Limited examinations for reoccurring indications may be performed as noted. The reflux portion of the exam is performed with the patient in reverse Trendelenburg.  +-----+---------------+---------+-----------+----------+--------------+ RIGHTCompressibilityPhasicitySpontaneityPropertiesThrombus Aging +-----+---------------+---------+-----------+----------+--------------+ CFV  Full           Yes      Yes                                 +-----+---------------+---------+-----------+----------+--------------+  Right Technical Findings: Rouleau flow noted in the common femoral vein.  +---------+---------------+---------+-----------+----------+--------------+ LEFT     CompressibilityPhasicitySpontaneityPropertiesThrombus Aging +---------+---------------+---------+-----------+----------+--------------+ CFV      Full           Yes      Yes                                 +---------+---------------+---------+-----------+----------+--------------+ SFJ      Full           Yes      Yes                                 +---------+---------------+---------+-----------+----------+--------------+ FV Prox  Full           Yes      Yes                                 +---------+---------------+---------+-----------+----------+--------------+ FV Mid   Full                                                         +---------+---------------+---------+-----------+----------+--------------+ FV DistalFull           Yes      Yes                                 +---------+---------------+---------+-----------+----------+--------------+ PFV      Full                    No                                  +---------+---------------+---------+-----------+----------+--------------+ POP      Full           Yes      Yes                                 +---------+---------------+---------+-----------+----------+--------------+ PTV      Full                                                        +---------+---------------+---------+-----------+----------+--------------+ PERO     Full                                                        +---------+---------------+---------+-----------+----------+--------------+ Gastroc  Full                                                        +---------+---------------+---------+-----------+----------+--------------+  GSV      Full           Yes      Yes                                 +---------+---------------+---------+-----------+----------+--------------+     Summary: RIGHT: - No evidence of common femoral vein obstruction.  LEFT: - No evidence of deep vein thrombosis in the lower extremity. No indirect evidence of obstruction proximal to the inguinal ligament. - A cystic structure is found in the popliteal fossa. - Avascular complex cystic lesion in the popliteal fossa, measuring 4.7 cm in length x 2.3 cm in width. 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  *See table(s) above for measurements and observations.    Preliminary      Additional studies/ records that were reviewed today include:      Patient Name: Taffy, Delconte Date: 10/14/2021 Gender: Female D.O.B: 11-17-1960 Age (years): 61 Referring Provider: Skeet Latch Height (inches): 63 Interpreting Physician: Shelva Majestic MD, ABSM Weight (lbs):  172 RPSGT: Jorge Ny BMI: 30 MRN: [id number] Neck Size: 15.50 <br> <br> CLINICAL INFORMATION Sleep Study Type: Split Night CPAP   Indication for sleep study: Hypertension, Obesity, Snoring   Epworth Sleepiness Score: 9   SLEEP STUDY TECHNIQUE As per the AASM Manual for the Scoring of Sleep and Associated Events v2.3 (April 2016) with a hypopnea requiring 4% desaturations.   The channels recorded and monitored were frontal, central and occipital EEG, electrooculogram (EOG), submentalis EMG (chin), nasal and oral airflow, thoracic and abdominal wall motion, anterior tibialis EMG, snore microphone, electrocardiogram, and pulse oximetry. Continuous positive airway pressure (CPAP) was initiated when the patient met split night criteria and was titrated according to treat sleep-disordered breathing.   MEDICATIONS acetaminophen (TYLENOL) 500 MG tablet Ascorbic Acid (VITAMIN C PO) aspirin EC 81 MG tablet CALCIUM PO Cholecalciferol (VITAMIN D-3) 125 MCG (5000 UT) TABS hydrochlorothiazide (HYDRODIURIL) 25 MG tablet levothyroxine (SYNTHROID, LEVOTHROID) 88 MCG tablet pentoxifylline (TRENTAL) 400 MG CR tablet pravastatin (PRAVACHOL) 20 MG tablet traMADol (ULTRAM) 50 MG tablet TURMERIC PO vitamin E 180 MG (400 UNITS) capsule Medications self-administered by patient taken the night of the study : N/A   RESPIRATORY PARAMETERS Diagnostic Total AHI (/hr):            13.3     RDI (/hr):         14.5     OA Index (/hr):            0.4       CA Index (/hr):      0.0 REM AHI (/hr):            67.9     NREM AHI (/hr):          1.5       Supine AHI (/hr):         0.0       Non-supine AHI (/hr):        15.7 Min O2 Sat (%):          69.0     Mean O2 (%):  92.4     Time below 88% (min):           11.9        Titration Optimal Pressure (cm):            AHI at Optimal Pressure (/hr):  N/A      Min O2 at Optimal Pressure (%):       80.0 Supine % at Optimal (%):       N/A      Sleep % at  Optimal (%):         N/A         SLEEP ARCHITECTURE The recording time for the entire night was 428.3 minutes.   During a baseline period of 188.3 minutes, the patient slept for 148.9 minutes in REM and nonREM, yielding a sleep efficiency of 79.1%%. Sleep onset after lights out was 15.5 minutes with a REM latency of 109.5 minutes. The patient spent 5.4%% of the night in stage N1 sleep, 76.8%% in stage N2 sleep, 0.0%% in stage N3 and 17.8% in REM.   During the titration period of 234.6 minutes, the patient slept for 219.5 minutes in REM and nonREM, yielding a sleep efficiency of 93.6%%. Sleep onset after CPAP initiation was 1.3 minutes with a REM latency of 55.0 minutes. The patient spent 2.1%% of the night in stage N1 sleep, 62.4%% in stage N2 sleep, 0.0%% in stage N3 and 35.5% in REM.   CARDIAC DATA The 2 lead EKG demonstrated sinus rhythm. The mean heart rate was 100.0 beats per minute. Other EKG findings include: None.   LEG MOVEMENT DATA The total Periodic Limb Movements of Sleep (PLMS) were 0. The PLMS index was 0.0 .   IMPRESSIONS - Mild obstructive sleep apnea occurred during the diagnostic portion of the study (AHI 13.3 /h; RDI 14.5/h); however, sleep apnea was severe during REM sleep (AHI 67.9/h).  CPAP was initiated at 5 cm and was titrated to 13 cm (AHI 4.5/h; RDI 6.0/h; O2 nadir 89%).  - Severe oxygen desaturation was noted during the diagnostic portion of the study to a nadir of 69.0%. - The patient snored with moderate snoring volume during the diagnostic portion of the study. - No cardiac abnormalities were noted during this study. - Clinically significant periodic limb movements did not occur during sleep.   DIAGNOSIS - Obstructive Sleep Apnea (G47.33)   RECOMMENDATIONS - Recommend an initial trial of CPAP Auto with EPR of 3 at 13 - 20 cm of water with heated humidification. A ResMed AirFit  F20 medium size mask was used for the titration. - Effort should be made to  optimize nasal and oropharyngeal patency. - Avoid alcohol, sedatives and other CNS depressants that may worsen sleep apnea and disrupt normal sleep architecture. - Sleep hygiene should be reviewed to assess factors that may improve sleep quality. - Weight management and regular exercise should be initiated or continued. - Recommend a download and sleep clinic evaluation after 4 weeks of therapy   ASSESSMENT:    1. OSA (obstructive sleep apnea)   2. Essential hypertension   3. Morbid obesity ([place])   4. Nocturnal hypoxemia   5. Snoring   6. Gastroesophageal reflux disease, unspecified whether esophagitis present   7. Pure hypercholesterolemia     PLAN:  Ms. Samantha [name] is a 62 year old African-American female who has a history of hypertension, hyperlipidemia, GERD, and was recently found to have moderate overall sleep apnea which was very severe during REM sleep with an AHI of 67.9/h during REM sleep and O2 nadir at 69%.  She has been on CPAP therapy since set up date March 06, 2022.  She typically goes to bed between around midnight and wakes up around 6:30 to 7 AM.  I discussed with her optimal sleep  duration at 7 and 9 hours.  On her initial CPAP download, she is meeting compliance standards but sleep duration was suboptimal at 5 hours and 38 minutes.  She has required very high pressure with 95th percentile pressure 18.5 and maximum pressure at 19.3.  AHI was 3.1.  I had a very lengthy discussion with her today in the office and discussed normal sleep architecture and the disruptive sleep architecture associated with obstructive sleep apnea.  I reviewed with her so the night is non-REM sleep which is parasympathetic nervous system driven typically associated with his heart rate and blood pressure lowering with a 20 mm DIP and diastolic pressure.  I reviewed in detail the effect on cardiovascular health if patients have untreated sleep apnea with particular reference to its effects on  hypertension, nocturnal arrhythmias with increased sympathetic tone contributing to palpitations as well as increased atrial fibrillation risk.  I discussed its effects on insulin resistance contributing to increased glucose, GERD, as well as potential increased inflammation.  In addition we discussed nocturnal hypoxemia at contributing potentially to nocturnal ischemia both cardiac as well as cerebrovascular.  I also discussed the pathophysiology associated with frequent nocturia seen in patients with untreated sleep apnea and the benefit of therapy.  Over the past 2 weeks, due to a respiratory infection she has not used her CPAP device.  On exam, she does have a very large tongue with Mallampati scale of 4 explains why she was unable to sleep on her back of witnessed apneic events.  He has felt improved since initiating CPAP therapy.  With her current loads revealing a 95th percentile pressure at 18 to almost 19 cm of water, I am changing her device setting from 13-20 to a range of 15 to 20 cm of water.  He also has had some nasal irritation resulting from the fullface mask.  I am providing her a sample of a ResMed AirFit F30i mask which I believe she will tolerate well.  I again stressed the importance of optimal sleep duration if she has to get up at 6:30 AM she should at least try to go to bed around 10 PM if at all possible.  He has been successful with 9 pound weight loss from her peak weight of 275 to a current weight of 246 and has benefited from [facility] injections.  Her blood pressure today is stable on her hydrochlorothiazide.  She is on levothyroxine 88 mcg for hypothyroidism and is on rosuvastatin 20 mg for hyperlipidemia.  She was very appreciative of the time I spent and answered all her questions.  I will see her in 1 year for reevaluation or sooner as needed.   Medication Adjustments/Labs and Tests Ordered: Current medicines are reviewed at length with the patient today.  Concerns regarding  medicines are outlined above.  Medication changes, Labs and Tests ordered today are listed in the Patient Instructions below. Patient Instructions  Medication Instructions:  Your physician recommends that you continue on your current medications as directed. Please refer to the Current Medication list given to you today.  *If you need a refill on your cardiac medications before your next appointment, please call your pharmacy*   Lab Work: None   Testing/Procedures: None   Follow-Up: At [facility], you and your health needs are our priority.  As part of our continuing mission to provide you with exceptional heart care, we have created designated Provider Care Teams.  These Care Teams include your primary Cardiologist (physician) and Advanced  Practice Providers (APPs -  Physician Assistants and Nurse Practitioners) who all work together to provide you with the care you need, when you need it.   Your next appointment:   1 year(s)  Provider:   Shelva Majestic, MD     Signed, Shelva Majestic, MD, [facility], [place], American Board of Sleep Medicine   04/26/2022 1:09 [place] [address] [place], [place], [place]  [address] Phone: [phone]

## 2022-05-01 ENCOUNTER — Encounter (INDEPENDENT_AMBULATORY_CARE_PROVIDER_SITE_OTHER): Payer: Self-pay | Admitting: Internal Medicine

## 2022-05-01 ENCOUNTER — Ambulatory Visit (INDEPENDENT_AMBULATORY_CARE_PROVIDER_SITE_OTHER): Payer: BC Managed Care – PPO | Admitting: Internal Medicine

## 2022-05-01 VITALS — BP 131/71 | HR 70 | Temp 98.2°F | Ht 62.0 in | Wt 246.0 lb

## 2022-05-01 DIAGNOSIS — E669 Obesity, unspecified: Secondary | ICD-10-CM

## 2022-05-01 DIAGNOSIS — R7303 Prediabetes: Secondary | ICD-10-CM | POA: Diagnosis not present

## 2022-05-01 DIAGNOSIS — I10 Essential (primary) hypertension: Secondary | ICD-10-CM | POA: Diagnosis not present

## 2022-05-01 DIAGNOSIS — Z6841 Body Mass Index (BMI) 40.0 and over, adult: Secondary | ICD-10-CM | POA: Diagnosis not present

## 2022-05-01 NOTE — Progress Notes (Signed)
Office: [phone]  /  Fax: [place] [facility] Height: 5' 2"$  (1.575 m) Weight: 246 lb (111.6 kg) Temp: 98.2 F (36.8 C) Pulse Rate: 70 BP: 131/71 SpO2: 97 % Fasting: n Labs: n Today's Visit #: 6 Weight at Last VIsit: 245 lb Weight Lost Since Last Visit: +1  Body Fat %: 49.6 % Fat Mass (lbs): 122.2 lbs Muscle Mass (lbs): 118 lbs Total Body Water (lbs): 89 lbs Visceral Fat Rating : 18 Peak Weight: 275 lb Starting Date: 01/25/22 Starting Weight: 252 lb Total Weight Loss (lbs): 6 lb (2.722 kg)    HPI  Chief Complaint: OBESITY  Samantha [name] is here to discuss her progress with her obesity treatment plan. She is on the the Category 2 Plan and states she is following her eating plan approximately 80 % of the time. She states she is exercising 30 minutes 5 times per week.   Interval History:  Since last office visit she adherence to prescribed reduced calorie healthy diet is good. Drinking mostly water. Acknowledges. Eating protein for breakfast lunch and dinner.  She is currently on Wegovy 1 mg a day but has not noticed a significant change in appetite. Denies problems with appetite and hunger signals.  Denies problems with satiety and satiation.  Reports problems with eating patterns and portion control.  Sleeping approximately 6 hours a day.  Sleep described as non-restorative.  Stress levels are reported as high and due to Work.  Barriers identified none.    Pharmacotherapy: Wegovy 1 mg once a week  PHYSICAL EXAM:  Blood pressure 131/71, pulse 70, temperature 98.2 F (36.8 C), height 5' 2"$  (1.575 m), weight 246 lb (111.6 kg), SpO2 97 %. Body mass index is 44.99 kg/m.  General: She is overweight, cooperative, alert, well developed, and in no acute distress. PSYCH: Has normal mood, affect and thought process.   HEENT: EOMI, sclerae are anicteric. Lungs: Normal breathing effort, no conversational dyspnea. Extremities:  No edema.  Neurologic: No gross sensory or motor deficits. No tremors or fasciculations noted.    DIAGNOSTIC DATA REVIEWED:  BMET    Component Value Date/Time   NA 140 11/11/2021 0932   K 3.9 11/11/2021 0932   CL 100 11/11/2021 0932   CO2 23 11/11/2021 0932   GLUCOSE 111 (H) 11/11/2021 0932   BUN 8 11/11/2021 0932   CREATININE 0.87 11/11/2021 0932   CALCIUM 9.3 11/11/2021 0932   GFRNONAA 64 08/04/2019 0855   GFRAA 74 08/04/2019 0855   No results found for: "HGBA1C" Lab Results  Component Value Date   INSULIN 22.7 02/07/2022   No results found for: "TSH" CBC    Component Value Date/Time   WBC 6.0 02/07/2022 1157   RBC 4.59 02/07/2022 1157   HGB 13.0 02/07/2022 1157   HCT 39.1 02/07/2022 1157   PLT 320 02/07/2022 1157   MCV 85 02/07/2022 1157   MCH 28.3 02/07/2022 1157   MCHC 33.2 02/07/2022 1157   RDW 13.5 02/07/2022 1157   Iron Studies No results found for: "IRON", "TIBC", "FERRITIN", "IRONPCTSAT" Lipid Panel     Component Value Date/Time   CHOL 223 (H) 11/11/2021 0932   TRIG 112 11/11/2021 0932   HDL 53 11/11/2021 0932   CHOLHDL 4.2 11/11/2021 0932   LDLCALC 150 (H) 11/11/2021 0932   Hepatic Function Panel     Component Value Date/Time   PROT 6.9 11/11/2021 0932   ALBUMIN 4.2 11/11/2021 0932   AST 25 11/11/2021 0932  ALT 21 11/11/2021 0932   ALKPHOS 81 11/11/2021 0932   BILITOT 0.4 11/11/2021 0932   BILIDIR 0.10 08/09/2017 0918   No results found for: "TSH" Nutritional Lab Results  Component Value Date   VD25OH 41.6 02/07/2022     ASSESSMENT AND PLAN  TREATMENT PLAN FOR OBESITY:  Recommended Dietary Goals  Samantha [name] is currently in the action stage of change. As such, her goal is to continue weight management plan. She has agreed to the Category 2 Plan.  Behavioral Intervention  We discussed the following Behavioral Modification Strategies today: increasing lean protein intake, increasing vegetables, increase water intake, work on meal  planning and easy cooking plans, and think about ways to increase physical activity.  We also discussed about journaling and tracking calories.  Additional resources provided today: NA  Recommended Physical Activity Goals  Samantha [name] has been advised to work up to 150 minutes of moderate intensity aerobic activity a week and strengthening exercises 2-3 times per week for cardiovascular health, weight loss maintenance and preservation of muscle mass.   She has agreed to increase physical activity in their day and reduce sedentary time (increase NEAT).  and continue physical activity as is.    Pharmacotherapy We discussed various medication options to help Samantha [name] with her weight loss efforts and we both agreed to continue with incretin therapy.  I recommend increasing her Wegovy to 1.7 mg/week.  The patient does not experience a therapeutic response to recommend discontinuing medication.  ASSOCIATED CONDITIONS ADDRESSED TODAY  Prediabetes Assessment & Plan: Most recent A1c is 6.3 with associated elevated insulin levels.  Patient informed of disease state and risk of progression. This may contribute to abnormal cravings, fatigue and diabetes complications without having diabetes.   We reviewed treatment options which include weight loss of about 7 to 10% of body weight, increasing physical activity to 150 minutes a week of moderate intensity and pharmacotherapy with incretin mimetic.  I recommend increasing Wegovy to 1.7 mg a week.   Essential hypertension Assessment & Plan: Blood pressure at goal for age and risk category.  On hydrochlorothiazide without adverse effects.  Most recent renal parameters reviewed which showed normal electrolytes and kidney function.  Continue with weight loss therapy.  Monitor for symptoms of orthostasis while losing weight. Continue current regimen and home monitoring for a goal blood pressure of 120/80.    Obesity, current BMI 45      Return in about 2  weeks (around 05/15/2022) for For Weight Mangement with Dr. Gerarda Fraction.Marland Kitchen She was informed of the importance of frequent follow up visits to maximize her success with intensive lifestyle modifications for her multiple health conditions.   ATTESTASTION STATEMENTS:  Reviewed by clinician on day of visit: allergies, medications, problem list, medical history, surgical history, family history, social history, and previous encounter notes.   Time spent on visit including pre-visit chart review and post-visit care and charting was 30 minutes.    Thomes Dinning, MD

## 2022-05-01 NOTE — Assessment & Plan Note (Signed)
Most recent A1c is 6.3 with associated elevated insulin levels.  Patient informed of disease state and risk of progression. This may contribute to abnormal cravings, fatigue and diabetes complications without having diabetes.   We reviewed treatment options which include weight loss of about 7 to 10% of body weight, increasing physical activity to 150 minutes a week of moderate intensity and pharmacotherapy with incretin mimetic.  I recommend increasing Wegovy to 1.7 mg a week.

## 2022-05-01 NOTE — Assessment & Plan Note (Addendum)
Blood pressure at goal for age and risk category.  On hydrochlorothiazide without adverse effects.  Most recent renal parameters reviewed which showed normal electrolytes and kidney function.  Continue with weight loss therapy.  Monitor for symptoms of orthostasis while losing weight. Continue current regimen and home monitoring for a goal blood pressure of 120/80.

## 2022-05-08 ENCOUNTER — Telehealth: Payer: Self-pay | Admitting: Radiology

## 2022-05-08 NOTE — Telephone Encounter (Signed)
Patient left voicemail on triage line stating her PCP advised she needed to call here and make appointment with Dr. Erlinda Hong for [facility]. Per appointment tab in chart, patient called back and was given appointment for 05/17/2022.

## 2022-05-11 ENCOUNTER — Other Ambulatory Visit (HOSPITAL_BASED_OUTPATIENT_CLINIC_OR_DEPARTMENT_OTHER): Payer: Self-pay

## 2022-05-17 ENCOUNTER — Encounter: Payer: Self-pay | Admitting: Orthopaedic Surgery

## 2022-05-17 ENCOUNTER — Ambulatory Visit: Payer: BC Managed Care – PPO | Admitting: Orthopaedic Surgery

## 2022-05-17 DIAGNOSIS — Z6841 Body Mass Index (BMI) 40.0 and over, adult: Secondary | ICD-10-CM

## 2022-05-17 DIAGNOSIS — M1712 Unilateral primary osteoarthritis, left knee: Secondary | ICD-10-CM

## 2022-05-17 NOTE — Progress Notes (Signed)
Office Visit Note   Patient: Samantha [name]           Date of Birth: 1961-03-11           MRN: [id number] Visit Date: 05/17/2022              Requested by: Maurice Small, MD [place],  [place] [address] PCP: Maurice Small, MD   Assessment & Plan: Visit Diagnoses:  1. Primary osteoarthritis of left knee   2. Body mass index 45.0-49.9, adult (HCC)     Plan: Impression is incidental finding of Baker's cyst on Doppler.  Educated patient on the etiology Baker's cyst.  Questions encouraged and answered.  She will follow-up as needed for repeat steroid injection.  We discussed that her goal weight would be around 220 pounds to be a surgical candidate for total knee replacement.  Follow-Up Instructions: No follow-ups on file.   Orders:  No orders of the defined types were placed in this encounter.  No orders of the defined types were placed in this encounter.     Procedures: No procedures performed   Clinical Data: No additional findings.   Subjective: Chief Complaint  Patient presents with   Left Knee - Pain    HPI  [name] comes in today for follow-up of incidental finding of Baker's cyst found on recent Doppler to rule out DVT.  Review of Systems   Objective: Vital Signs: There were no vitals taken for this visit.  Physical Exam  Ortho Exam  Exam of the left knee is unchanged.  Specialty Comments:  No specialty comments available.  Imaging: No results found.   PMFS History: Patient Active Problem List   Diagnosis Date Noted   Prediabetes 04/13/2022   Other fatigue 01/17/2022   SOB (shortness of breath) 01/17/2022   Depression screening 01/17/2022   OSA (obstructive sleep apnea) 11/11/2021   Pure hypercholesterolemia 08/11/2021   Snoring 08/11/2021   Bilateral primary osteoarthritis of knee 06/16/2021   Body mass index 45.0-49.9, adult ([place]) 06/16/2021   GERD (gastroesophageal reflux disease) 04/20/2019   Essential  hypertension 04/20/2019   Bilateral hip pain 05/09/2018   Body mass index 40.0-44.9, adult ([place]) 05/09/2018   Class 3 severe obesity with serious comorbidity and body mass index (BMI) of 45.0 to 49.9 in adult ([place]) 05/09/2018   Chronic pain of both knees 08/17/2017   Past Medical History:  Diagnosis Date   ADD (attention deficit disorder)    Arthritis of both knees    Cancer ([place])    Colon Cancer adn polyps   Edema of both lower extremities    Essential hypertension 04/20/2019   GERD (gastroesophageal reflux disease) 04/20/2019   History of hysterectomy    patrial hysterectomy   Hyperlipidemia    Hypothyroidism (acquired)    Multiple thyroid nodules   Joint pain    OSA (obstructive sleep apnea) 11/11/2021   Pure hypercholesterolemia 08/11/2021   Snoring 08/11/2021   Thyroid disease    Hypothyroidism   Vertigo     Family History  Problem Relation Age of Onset   Thyroid disease Mother    Hearing loss Mother    Glaucoma Mother    Hyperlipidemia Mother    High Cholesterol Mother    Sleep apnea Mother    Colon cancer Maternal Grandmother     Past Surgical History:  Procedure Laterality Date   ABDOMINAL HYSTERECTOMY     CESAREAN SECTION     Social History  Occupational History   Occupation: Scientist, forensic  Tobacco Use   Smoking status: Never   Smokeless tobacco: Never  Substance and Sexual Activity   Alcohol use: No   Drug use: No   Sexual activity: Not on file

## 2022-05-18 ENCOUNTER — Ambulatory Visit (INDEPENDENT_AMBULATORY_CARE_PROVIDER_SITE_OTHER): Payer: BC Managed Care – PPO | Admitting: Internal Medicine

## 2022-05-18 ENCOUNTER — Encounter (INDEPENDENT_AMBULATORY_CARE_PROVIDER_SITE_OTHER): Payer: Self-pay | Admitting: Internal Medicine

## 2022-05-18 VITALS — BP 117/78 | HR 68 | Temp 97.8°F | Ht 62.0 in | Wt 241.0 lb

## 2022-05-18 DIAGNOSIS — G479 Sleep disorder, unspecified: Secondary | ICD-10-CM | POA: Diagnosis not present

## 2022-05-18 DIAGNOSIS — Z6841 Body Mass Index (BMI) 40.0 and over, adult: Secondary | ICD-10-CM | POA: Diagnosis not present

## 2022-05-18 DIAGNOSIS — E669 Obesity, unspecified: Secondary | ICD-10-CM

## 2022-05-18 DIAGNOSIS — R7303 Prediabetes: Secondary | ICD-10-CM

## 2022-05-18 NOTE — Assessment & Plan Note (Signed)
Patient counseled on sleep hygiene and was provided information and resources.  We also spoke about the importance of getting at least 7 hours of restful sleep.  Inadequate sleep may affect her weight.  She will reduce bluelight and work on relaxation techniques before bedtime

## 2022-05-18 NOTE — Assessment & Plan Note (Signed)
Her most recent A1c was 6.3.  She is currently on Wegovy without any adverse effects.  She will continue with nutritional strategies, weight loss and incretin therapy.  I would like for her to stay on Wegovy 1.7 mg a week as she is experiencing decreases in appetite and at times finds it difficult to eat the right amount of prescribed nutrition.  We do not want this to result in restrictive eating as a may result in muscle loss.  Patient agrees with staying at current dose

## 2022-05-18 NOTE — Progress Notes (Signed)
Office: [phone]  /  Fax: [phone]  WEIGHT SUMMARY AND BIOMETRICS  Vitals Temp: 97.8 F (36.6 C) BP: 117/78 Pulse Rate: 68 SpO2: 99 %   Anthropometric Measurements Height: '5\' 2"'$  (1.575 m) Weight: 241 lb (109.3 kg) BMI (Calculated): 44.07 Weight at Last Visit: 246 lb Weight Lost Since Last Visit: 5 lb Starting Weight: 252 lb Total Weight Loss (lbs): 11 lb (4.99 kg) Peak Weight: 275 lb   Body Composition  Body Fat %: 47.3 % Fat Mass (lbs): 114 lbs Muscle Mass (lbs): 120.8 lbs Total Body Water (lbs): 87.2 lbs Visceral Fat Rating : 17   Other Clinical Data Fasting: No Labs: No Today's Visit #: 7 Starting Date: 01/25/22    HPI  Chief Complaint: OBESITY  Samantha [name] is here to discuss her progress with her obesity treatment plan. She is on the the Category 2 Plan and states she is following her eating plan approximately 80 % of the time. She states she is exercising 30-45 minutes 3 times per week.  Interval History:  Since last office visit she has lost 6 lbs. She reports good adherence to prescribed reduced calorie nutrition plan. She has been working on eating balanced getting more fruit. '[x]'$ Denies '[]'$ Reports problems with appetite and hunger signals.  '[x]'$ Denies '[]'$ Reports problems with satiety and satiation.  '[x]'$ Denies '[]'$ Reports problems with eating patterns and portion control.  '[x]'$ Denies '[]'$ Reports abnormal cravings Sleeping approximately 5 hours a day.  Sleep described as: '[x]'$ Restorative '[]'$ Unrestorative '[]'$ Interrupted Stress levels are reported as high and due to Work.  Barriers identified none.   Pharmacotherapy for weight loss: She is currently taking Wegovy.  Without any side effects.  She is currently at the 1.7 mg a week dose  Weight promoting medications identified: None.  ASSESSMENT AND PLAN  TREATMENT PLAN FOR OBESITY:  Recommended Dietary Goals  [name] is currently in the action stage of change. As such, her goal is to continue weight  management plan. She has agreed to: the Category 2 Plan.  Behavioral Intervention  We discussed the following Behavioral Modification Strategies today: increasing lean protein intake, decreasing simple carbohydrates , increasing vegetables, increasing lower glycemic fruits, increasing fiber rich foods, avoiding skipping meals, increasing water intake, and work on meal planning and easy cooking plans.  Additional resources provided today:   Recommended Physical Activity Goals  [name] has been advised to work up to 150 minutes of moderate intensity aerobic activity a week and strengthening exercises 2-3 times per week for cardiovascular health, weight loss maintenance and preservation of muscle mass.   She has agreed to : '[x]'$  Continue current level of physical activity '[]'$  Start strengthening exercises with a goal of 2-3 sessions a week   '[]'$  Start aerobic activity with a goal of 150 minutes a week at moderate intensity. '[]'$ Increase intensity, frequency or duration of strengthening exercises '[]'$ Increase the intensity, frequency or duration of aerobic exercises  '[]'$ Increase physical activity in their day and reduce sedentary time (increase NEAT). '[]'$  Work on scheduling and tracking physical activity.   Pharmacotherapy We discussed various medication options to help Billi with her weight loss efforts and we both agreed to : '[x]'$  Continue with nutritional and behavioral strategies '[x]'$  Continue current antiobesity medication (AOM). '[]'$ Start AOM   ASSOCIATED CONDITIONS ADDRESSED TODAY  Difficulty sleeping Assessment & Plan: Patient counseled on sleep hygiene and was provided information and resources.  We also spoke about the importance of getting at least 7 hours of restful sleep.  Inadequate sleep may affect her weight.  She will  reduce bluelight and work on relaxation techniques before bedtime   Prediabetes Assessment & Plan: Her most recent A1c was 6.3.  She is currently on Wegovy without any adverse  effects.  She will continue with nutritional strategies, weight loss and incretin therapy.  I would like for her to stay on Wegovy 1.7 mg a week as she is experiencing decreases in appetite and at times finds it difficult to eat the right amount of prescribed nutrition.  We do not want this to result in restrictive eating as a may result in muscle loss.  Patient agrees with staying at current dose   Obesity, current BMI 44     PHYSICAL EXAM:  Blood pressure 117/78, pulse 68, temperature 97.8 F (36.6 C), height '5\' 2"'$  (1.575 m), weight 241 lb (109.3 kg), SpO2 99 %. Body mass index is 44.08 kg/m.  General: She is overweight, cooperative, alert, well developed, and in no acute distress. PSYCH: Has normal mood, affect and thought process.   HEENT: EOMI, sclerae are anicteric. Lungs: Normal breathing effort, no conversational dyspnea. Extremities: No edema.  Neurologic: No gross sensory or motor deficits. No tremors or fasciculations noted.    DIAGNOSTIC DATA REVIEWED:  BMET    Component Value Date/Time   NA 140 11/11/2021 0932   K 3.9 11/11/2021 0932   CL 100 11/11/2021 0932   CO2 23 11/11/2021 0932   GLUCOSE 111 (H) 11/11/2021 0932   BUN 8 11/11/2021 0932   CREATININE 0.87 11/11/2021 0932   CALCIUM 9.3 11/11/2021 0932   GFRNONAA 64 08/04/2019 0855   GFRAA 74 08/04/2019 0855   No results found for: "HGBA1C" Lab Results  Component Value Date   INSULIN 22.7 02/07/2022   No results found for: "TSH" CBC    Component Value Date/Time   WBC 6.0 02/07/2022 1157   RBC 4.59 02/07/2022 1157   HGB 13.0 02/07/2022 1157   HCT 39.1 02/07/2022 1157   PLT 320 02/07/2022 1157   MCV 85 02/07/2022 1157   MCH 28.3 02/07/2022 1157   MCHC 33.2 02/07/2022 1157   RDW 13.5 02/07/2022 1157   Iron Studies No results found for: "IRON", "TIBC", "FERRITIN", "IRONPCTSAT" Lipid Panel     Component Value Date/Time   CHOL 223 (H) 11/11/2021 0932   TRIG 112 11/11/2021 0932   HDL 53 11/11/2021  0932   CHOLHDL 4.2 11/11/2021 0932   LDLCALC 150 (H) 11/11/2021 0932   Hepatic Function Panel     Component Value Date/Time   PROT 6.9 11/11/2021 0932   ALBUMIN 4.2 11/11/2021 0932   AST 25 11/11/2021 0932   ALT 21 11/11/2021 0932   ALKPHOS 81 11/11/2021 0932   BILITOT 0.4 11/11/2021 0932   BILIDIR 0.10 08/09/2017 0918   No results found for: "TSH" Nutritional Lab Results  Component Value Date   VD25OH 41.6 02/07/2022     Return in about 2 weeks (around 06/01/2022) for For Weight Mangement with Dr. Gerarda Fraction.Marland Kitchen She was informed of the importance of frequent follow up visits to maximize her success with intensive lifestyle modifications for her multiple health conditions.   ATTESTASTION STATEMENTS:  Reviewed by clinician on day of visit: allergies, medications, problem list, medical history, surgical history, family history, social history, and previous encounter notes.   Thomes Dinning, MD

## 2022-05-31 ENCOUNTER — Other Ambulatory Visit (HOSPITAL_BASED_OUTPATIENT_CLINIC_OR_DEPARTMENT_OTHER): Payer: Self-pay

## 2022-05-31 MED ORDER — VALACYCLOVIR HCL 1 G PO TABS
1000.0000 mg | ORAL_TABLET | Freq: Two times a day (BID) | ORAL | 5 refills | Status: DC | PRN
  Filled 2022-05-31: qty 20, 10d supply, fill #0
  Filled 2023-04-03: qty 20, 10d supply, fill #1

## 2022-05-31 MED ORDER — MECLIZINE HCL 25 MG PO TABS
25.0000 mg | ORAL_TABLET | Freq: Two times a day (BID) | ORAL | 0 refills | Status: AC
  Filled 2022-05-31: qty 30, 15d supply, fill #0

## 2022-05-31 MED ORDER — ONDANSETRON HCL 4 MG PO TABS
4.0000 mg | ORAL_TABLET | Freq: Every day | ORAL | 0 refills | Status: DC | PRN
  Filled 2022-05-31: qty 15, 15d supply, fill #0

## 2022-05-31 NOTE — Progress Notes (Signed)
This encounter was created in error - please disregard.

## 2022-06-02 ENCOUNTER — Other Ambulatory Visit (HOSPITAL_BASED_OUTPATIENT_CLINIC_OR_DEPARTMENT_OTHER): Payer: Self-pay

## 2022-06-05 ENCOUNTER — Ambulatory Visit (INDEPENDENT_AMBULATORY_CARE_PROVIDER_SITE_OTHER): Payer: BC Managed Care – PPO | Admitting: Internal Medicine

## 2022-06-05 ENCOUNTER — Encounter (INDEPENDENT_AMBULATORY_CARE_PROVIDER_SITE_OTHER): Payer: Self-pay | Admitting: Internal Medicine

## 2022-06-05 VITALS — BP 118/73 | HR 70 | Temp 98.2°F | Ht 62.0 in | Wt 240.0 lb

## 2022-06-05 DIAGNOSIS — G479 Sleep disorder, unspecified: Secondary | ICD-10-CM

## 2022-06-05 DIAGNOSIS — R7303 Prediabetes: Secondary | ICD-10-CM | POA: Diagnosis not present

## 2022-06-05 DIAGNOSIS — Z6841 Body Mass Index (BMI) 40.0 and over, adult: Secondary | ICD-10-CM

## 2022-06-05 DIAGNOSIS — I10 Essential (primary) hypertension: Secondary | ICD-10-CM

## 2022-06-05 DIAGNOSIS — E669 Obesity, unspecified: Secondary | ICD-10-CM | POA: Diagnosis not present

## 2022-06-05 NOTE — Assessment & Plan Note (Signed)
Blood pressure at goal for age and risk category.  On hydrochlorothiazide without adverse effects.  Most recent renal parameters reviewed which showed normal electrolytes and kidney function.  Continue with weight loss therapy.  Monitor for symptoms of orthostasis while losing weight. Continue current regimen and home monitoring for a goal blood pressure of 120/80.  

## 2022-06-05 NOTE — Progress Notes (Signed)
Office: [phone]  /  Fax: [phone]  WEIGHT SUMMARY AND BIOMETRICS  Vitals Temp: 98.2 F (36.8 C) BP: 118/73 Pulse Rate: 70 SpO2: 100 %   Anthropometric Measurements Height: 5\' 2"  (1.575 m) Weight: 240 lb (108.9 kg) BMI (Calculated): 43.89 Weight at Last Visit: 241 lb Weight Lost Since Last Visit: 1 lb Starting Weight: 252 lb Total Weight Loss (lbs): 12 lb (5.443 kg) Peak Weight: 275 lb   Body Composition  Body Fat %: 45.6 % Fat Mass (lbs): 109.4 lbs Muscle Mass (lbs): 124 lbs Total Body Water (lbs): 85.2 lbs Visceral Fat Rating : 16    HPI  Chief Complaint: OBESITY  Samantha [name] is here to discuss her progress with her obesity treatment plan. She is on the the Category 2 Plan and states she is following her eating plan approximately 80 % of the time. She states she is not exercising.  Interval History:  Since last office visit she has 1 lb. Total of 34 lbs since July. Acknowledges eating more out and skipping meals. PA also decreased. Less food prep.   She reports fair adherence to reduced calorie nutritional plan. [x] Denies [] Reports problems with appetite and hunger signals.  [x] Denies [] Reports problems with satiety and satiation.  [x] Denies [] Reports problems with eating patterns and portion control.  [x] Denies [] Reports abnormal cravings Sleeping approximately 7 hours a day.  Sleep described as: [x] Restorative [] Unrestorative [] Interrupted Stress levels are reported as high and due to Work.  Barriers identified other:dieting fatigue .   Pharmacotherapy for weight loss: She is currently taking Wegovy 1.7 mg a week.  This is being prescribed by another provider.   ASSESSMENT AND PLAN  TREATMENT PLAN FOR OBESITY:  Recommended Dietary Goals  Samantha [name] is currently in the action stage of change. As such, her goal is to continue weight management plan. She has agreed to: the Category 2 Plan.  Behavioral Intervention  We discussed the following  Behavioral Modification Strategies today: increasing lean protein intake, increasing vegetables, avoiding skipping meals, increasing water intake, work on meal planning and easy cooking plans, work on tracking and journaling calories using tracking App, decreasing eating out, consumption of processed foods, and making healthy choices when eating convenient foods, and reading food labels .  Additional resources provided today: None  Recommended Physical Activity Goals  Samantha [name] has been advised to work up to 150 minutes of moderate intensity aerobic activity a week and strengthening exercises 2-3 times per week for cardiovascular health, weight loss maintenance and preservation of muscle mass.   She has agreed to :  Start aerobic activity with a goal of 150 minutes a week at moderate intensity.   Pharmacotherapy We discussed various medication options to help Samantha [name] with her weight loss efforts and we both agreed to : continue current anti-obesity medication regimen  ASSOCIATED CONDITIONS ADDRESSED TODAY  Prediabetes Assessment & Plan: Her most recent A1c was 6.3.  She is currently on Wegovy without any adverse effects.  She will continue with nutritional strategies, weight loss and incretin therapy.  I would like for her to stay on Wegovy 1.7 mg a week as she is experiencing decreases in appetite and at times finds it difficult to eat the right amount of prescribed nutrition.  We do not want this to result in restrictive eating as a may result in muscle loss.  Patient agrees with staying at current dose   Obesity, current BMI 44  Essential hypertension Assessment & Plan: Blood pressure at goal for age and risk category.  On hydrochlorothiazide without adverse effects.  Most recent renal parameters reviewed which showed normal electrolytes and kidney function.  Continue with weight loss therapy.  Monitor for symptoms of orthostasis while losing weight. Continue current regimen and home  monitoring for a goal blood pressure of 120/80.    Difficulty sleeping Assessment & Plan: Improved. Continue with sleep hygiene.      PHYSICAL EXAM:  Blood pressure 118/73, pulse 70, temperature 98.2 F (36.8 C), height 5\' 2"  (1.575 m), weight 240 lb (108.9 kg), SpO2 100 %. Body mass index is 43.9 kg/m.  General: She is overweight, cooperative, alert, well developed, and in no acute distress. PSYCH: Has normal mood, affect and thought process.   HEENT: EOMI, sclerae are anicteric. Lungs: Normal breathing effort, no conversational dyspnea. Extremities: No edema.  Neurologic: No gross sensory or motor deficits. No tremors or fasciculations noted.    DIAGNOSTIC DATA REVIEWED:  BMET    Component Value Date/Time   NA 140 11/11/2021 0932   K 3.9 11/11/2021 0932   CL 100 11/11/2021 0932   CO2 23 11/11/2021 0932   GLUCOSE 111 (H) 11/11/2021 0932   BUN 8 11/11/2021 0932   CREATININE 0.87 11/11/2021 0932   CALCIUM 9.3 11/11/2021 0932   GFRNONAA 64 08/04/2019 0855   GFRAA 74 08/04/2019 0855   No results found for: "HGBA1C" Lab Results  Component Value Date   INSULIN 22.7 02/07/2022   No results found for: "TSH" CBC    Component Value Date/Time   WBC 6.0 02/07/2022 1157   RBC 4.59 02/07/2022 1157   HGB 13.0 02/07/2022 1157   HCT 39.1 02/07/2022 1157   PLT 320 02/07/2022 1157   MCV 85 02/07/2022 1157   MCH 28.3 02/07/2022 1157   MCHC 33.2 02/07/2022 1157   RDW 13.5 02/07/2022 1157   Iron Studies No results found for: "IRON", "TIBC", "FERRITIN", "IRONPCTSAT" Lipid Panel     Component Value Date/Time   CHOL 223 (H) 11/11/2021 0932   TRIG 112 11/11/2021 0932   HDL 53 11/11/2021 0932   CHOLHDL 4.2 11/11/2021 0932   LDLCALC 150 (H) 11/11/2021 0932   Hepatic Function Panel     Component Value Date/Time   PROT 6.9 11/11/2021 0932   ALBUMIN 4.2 11/11/2021 0932   AST 25 11/11/2021 0932   ALT 21 11/11/2021 0932   ALKPHOS 81 11/11/2021 0932   BILITOT 0.4  11/11/2021 0932   BILIDIR 0.10 08/09/2017 0918   No results found for: "TSH" Nutritional Lab Results  Component Value Date   VD25OH 41.6 02/07/2022     Return in about 2 weeks (around 06/19/2022) for For Weight Mangement with Dr. Gerarda Fraction.Marland Kitchen She was informed of the importance of frequent follow up visits to maximize her success with intensive lifestyle modifications for her multiple health conditions.   ATTESTASTION STATEMENTS:  Reviewed by clinician on day of visit: allergies, medications, problem list, medical history, surgical history, family history, social history, and previous encounter notes.     Thomes Dinning, MD

## 2022-06-05 NOTE — Assessment & Plan Note (Signed)
Her most recent A1c was 6.3.  She is currently on Wegovy without any adverse effects.  She will continue with nutritional strategies, weight loss and incretin therapy.  I would like for her to stay on Wegovy 1.7 mg a week as she is experiencing decreases in appetite and at times finds it difficult to eat the right amount of prescribed nutrition.  We do not want this to result in restrictive eating as a may result in muscle loss.  Patient agrees with staying at current dose 

## 2022-06-05 NOTE — Assessment & Plan Note (Signed)
Improved. Continue with sleep hygiene.

## 2022-06-07 ENCOUNTER — Other Ambulatory Visit (HOSPITAL_BASED_OUTPATIENT_CLINIC_OR_DEPARTMENT_OTHER): Payer: Self-pay

## 2022-06-27 ENCOUNTER — Ambulatory Visit (INDEPENDENT_AMBULATORY_CARE_PROVIDER_SITE_OTHER): Payer: BC Managed Care – PPO | Admitting: Internal Medicine

## 2022-06-27 ENCOUNTER — Encounter (INDEPENDENT_AMBULATORY_CARE_PROVIDER_SITE_OTHER): Payer: Self-pay | Admitting: Internal Medicine

## 2022-06-27 VITALS — BP 122/82 | HR 66 | Temp 98.3°F | Ht 62.0 in | Wt 236.0 lb

## 2022-06-27 DIAGNOSIS — I10 Essential (primary) hypertension: Secondary | ICD-10-CM

## 2022-06-27 DIAGNOSIS — Z6841 Body Mass Index (BMI) 40.0 and over, adult: Secondary | ICD-10-CM

## 2022-06-27 DIAGNOSIS — E669 Obesity, unspecified: Secondary | ICD-10-CM

## 2022-06-27 DIAGNOSIS — G479 Sleep disorder, unspecified: Secondary | ICD-10-CM | POA: Diagnosis not present

## 2022-06-27 DIAGNOSIS — R7303 Prediabetes: Secondary | ICD-10-CM

## 2022-06-27 NOTE — Progress Notes (Signed)
Office: [phone]  /  Fax: [phone]  WEIGHT SUMMARY AND BIOMETRICS  Vitals Temp: 98.3 F (36.8 C) BP: 122/82 Pulse Rate: 66 SpO2: 100 %   Anthropometric Measurements Height: 5\' 2"  (1.575 m) Weight: 236 lb (107 kg) BMI (Calculated): 43.15 Weight at Last Visit: 240 lb Weight Lost Since Last Visit: 4 lb Starting Weight: 252 lb Total Weight Loss (lbs): 16 lb (7.258 kg) Peak Weight: 275 lb   Body Composition  Body Fat %: 54.9 % Fat Mass (lbs): 129.6 lbs Muscle Mass (lbs): 101.2 lbs Visceral Fat Rating : 19    HPI  Chief Complaint: OBESITY  Samantha [name] is here to discuss her progress with her obesity treatment plan. She is on the the Category 2 Plan and states she is following her eating plan approximately 80 % of the time. She states she is not exercising .  Interval History:  Since last office visit she has lost 4 lbs. She reports good adherence to reduced calorie nutritional plan. She has been working on not skipping meals, increasing protein at every meal, eating more fruits, eating more vegetables, drinking more water, avoiding and or reducing liquid calories, journaling and tracking calories, and making healthier choices Denies problems with appetite and hunger signals.  Denies problems with satiety and satiation.  Denies problems with eating patterns and portion control.  Denies abnormal cravings. Denies feeling deprived or restricted.   Barriers identified: orthopedic problems or chronic pain affecting mobility.   Pharmacotherapy for weight loss: She is currently taking Wegovy without adverse effects   ASSESSMENT AND PLAN  TREATMENT PLAN FOR OBESITY:  Recommended Dietary Goals  Samantha [name] is currently in the action stage of change. As such, her goal is to continue weight management plan. She has agreed to: continue current plan  Behavioral Intervention  We discussed the following Behavioral Modification Strategies today: increasing lean protein intake,  work on tracking and journaling calories using tracking application, and reading food labels .  Additional resources provided today: None  Recommended Physical Activity Goals  Samantha [name] has been advised to work up to 150 minutes of moderate intensity aerobic activity a week and strengthening exercises 2-3 times per week for cardiovascular health, weight loss maintenance and preservation of muscle mass.   She has agreed to :  Think about ways to increase physical activity  Pharmacotherapy We discussed various medication options to help Samantha [name] with her weight loss efforts and we both agreed to : continue current anti-obesity medication regimen.  Her insurance is no longer covering Wegovy we will likely transition patient to metformin plus minus topiramate for appetite suppression if needed.  ASSOCIATED CONDITIONS ADDRESSED TODAY  Prediabetes Assessment & Plan: Her most recent A1c was 6.3.  She is currently on Wegovy without any adverse effects.  She will continue with nutritional strategies, weight loss and incretin therapy.  After she has completed Wegovy as medication is no longer covered she will be transition to metformin for pharmacoprophylaxis and incretin effect.   Obesity, current BMI 44  Essential hypertension Assessment & Plan: Blood pressure at goal for age and risk category.  On hydrochlorothiazide without adverse effects.  Most recent renal parameters reviewed which showed normal electrolytes and kidney function.  Continue with weight loss therapy.  Monitor for symptoms of orthostasis while losing weight. Continue current regimen and home monitoring for a goal blood pressure of 120/80.    Difficulty sleeping Assessment & Plan: Improved. Continue with sleep hygiene.      PHYSICAL EXAM:  Blood pressure 122/82,  pulse 66, temperature 98.3 F (36.8 C), height 5\' 2"  (1.575 m), weight 236 lb (107 kg), SpO2 100 %. Body mass index is 43.16 kg/m.  General: She is overweight,  cooperative, alert, well developed, and in no acute distress. PSYCH: Has normal mood, affect and thought process.   HEENT: EOMI, sclerae are anicteric. Lungs: Normal breathing effort, no conversational dyspnea. Extremities: No edema.  Neurologic: No gross sensory or motor deficits. No tremors or fasciculations noted.    DIAGNOSTIC DATA REVIEWED:  BMET    Component Value Date/Time   NA 140 11/11/2021 0932   K 3.9 11/11/2021 0932   CL 100 11/11/2021 0932   CO2 23 11/11/2021 0932   GLUCOSE 111 (H) 11/11/2021 0932   BUN 8 11/11/2021 0932   CREATININE 0.87 11/11/2021 0932   CALCIUM 9.3 11/11/2021 0932   GFRNONAA 64 08/04/2019 0855   GFRAA 74 08/04/2019 0855   No results found for: "HGBA1C" Lab Results  Component Value Date   INSULIN 22.7 02/07/2022   No results found for: "TSH" CBC    Component Value Date/Time   WBC 6.0 02/07/2022 1157   RBC 4.59 02/07/2022 1157   HGB 13.0 02/07/2022 1157   HCT 39.1 02/07/2022 1157   PLT 320 02/07/2022 1157   MCV 85 02/07/2022 1157   MCH 28.3 02/07/2022 1157   MCHC 33.2 02/07/2022 1157   RDW 13.5 02/07/2022 1157   Iron Studies No results found for: "IRON", "TIBC", "FERRITIN", "IRONPCTSAT" Lipid Panel     Component Value Date/Time   CHOL 223 (H) 11/11/2021 0932   TRIG 112 11/11/2021 0932   HDL 53 11/11/2021 0932   CHOLHDL 4.2 11/11/2021 0932   LDLCALC 150 (H) 11/11/2021 0932   Hepatic Function Panel     Component Value Date/Time   PROT 6.9 11/11/2021 0932   ALBUMIN 4.2 11/11/2021 0932   AST 25 11/11/2021 0932   ALT 21 11/11/2021 0932   ALKPHOS 81 11/11/2021 0932   BILITOT 0.4 11/11/2021 0932   BILIDIR 0.10 08/09/2017 0918   No results found for: "TSH" Nutritional Lab Results  Component Value Date   VD25OH 41.6 02/07/2022     Return in about 3 weeks (around 07/18/2022) for For Weight Mangement with Dr. Rikki Spearing.Marland Kitchen She was informed of the importance of frequent follow up visits to maximize her success with intensive  lifestyle modifications for her multiple health conditions.   ATTESTASTION STATEMENTS:  Reviewed by clinician on day of visit: allergies, medications, problem list, medical history, surgical history, family history, social history, and previous encounter notes.     Worthy Rancher, MD

## 2022-06-27 NOTE — Assessment & Plan Note (Signed)
Her most recent A1c was 6.3.  She is currently on Wegovy without any adverse effects.  She will continue with nutritional strategies, weight loss and incretin therapy.  After she has completed Wegovy as medication is no longer covered she will be transition to metformin for pharmacoprophylaxis and incretin effect.

## 2022-06-27 NOTE — Assessment & Plan Note (Signed)
Improved. Continue with sleep hygiene. 

## 2022-06-27 NOTE — Assessment & Plan Note (Signed)
Blood pressure at goal for age and risk category.  On hydrochlorothiazide without adverse effects.  Most recent renal parameters reviewed which showed normal electrolytes and kidney function.  Continue with weight loss therapy.  Monitor for symptoms of orthostasis while losing weight. Continue current regimen and home monitoring for a goal blood pressure of 120/80.  

## 2022-07-24 ENCOUNTER — Ambulatory Visit (INDEPENDENT_AMBULATORY_CARE_PROVIDER_SITE_OTHER): Payer: BC Managed Care – PPO | Admitting: Internal Medicine

## 2022-09-12 ENCOUNTER — Other Ambulatory Visit (HOSPITAL_BASED_OUTPATIENT_CLINIC_OR_DEPARTMENT_OTHER): Payer: Self-pay

## 2022-09-12 MED ORDER — HYDROCORTISONE 2.5 % EX CREA
1.0000 | TOPICAL_CREAM | Freq: Two times a day (BID) | CUTANEOUS | 0 refills | Status: AC
  Filled 2022-09-12: qty 60, 14d supply, fill #0

## 2022-09-13 ENCOUNTER — Other Ambulatory Visit (HOSPITAL_BASED_OUTPATIENT_CLINIC_OR_DEPARTMENT_OTHER): Payer: Self-pay

## 2022-09-13 MED ORDER — PAXLOVID (300/100) 20 X 150 MG & 10 X 100MG PO TBPK
3.0000 | ORAL_TABLET | Freq: Two times a day (BID) | ORAL | 0 refills | Status: AC
  Filled 2022-09-13: qty 30, 5d supply, fill #0

## 2022-11-11 ENCOUNTER — Other Ambulatory Visit (HOSPITAL_BASED_OUTPATIENT_CLINIC_OR_DEPARTMENT_OTHER): Payer: Self-pay | Admitting: Cardiovascular Disease

## 2022-11-13 ENCOUNTER — Other Ambulatory Visit (HOSPITAL_BASED_OUTPATIENT_CLINIC_OR_DEPARTMENT_OTHER): Payer: Self-pay

## 2022-11-13 NOTE — Telephone Encounter (Signed)
Rx request sent to pharmacy.  

## 2022-12-19 ENCOUNTER — Other Ambulatory Visit (HOSPITAL_BASED_OUTPATIENT_CLINIC_OR_DEPARTMENT_OTHER): Payer: Self-pay

## 2022-12-19 MED ORDER — BENZONATATE 200 MG PO CAPS
200.0000 mg | ORAL_CAPSULE | Freq: Three times a day (TID) | ORAL | 0 refills | Status: DC | PRN
  Filled 2022-12-19: qty 45, 15d supply, fill #0

## 2023-01-16 ENCOUNTER — Other Ambulatory Visit: Payer: Self-pay | Admitting: Family Medicine

## 2023-01-16 DIAGNOSIS — E2839 Other primary ovarian failure: Secondary | ICD-10-CM

## 2023-02-26 ENCOUNTER — Other Ambulatory Visit (HOSPITAL_BASED_OUTPATIENT_CLINIC_OR_DEPARTMENT_OTHER): Payer: Self-pay

## 2023-02-26 MED ORDER — LEVOTHYROXINE SODIUM 88 MCG PO TABS
88.0000 ug | ORAL_TABLET | Freq: Every day | ORAL | 4 refills | Status: DC
  Filled 2023-02-26: qty 90, 90d supply, fill #0
  Filled 2024-02-19: qty 30, 30d supply, fill #0

## 2023-04-15 NOTE — Progress Notes (Unsigned)
Office Visit Note   Patient: Samantha [name]           Date of Birth: 1960-09-02           MRN: [id number] Visit Date: 04/17/2023              Requested by: Shirlean Mylar, MD [address] [place],  [place] [address] PCP: Shirlean Mylar, MD   Assessment & Plan: Visit Diagnoses: No diagnosis found.  Plan: ***  Follow-Up Instructions: No follow-ups on file.   Orders:  No orders of the defined types were placed in this encounter.  No orders of the defined types were placed in this encounter.     Procedures: No procedures performed   Clinical Data: No additional findings.   Subjective: No chief complaint on file.   HPI  Review of Systems  Constitutional: Negative.   HENT: Negative.    Eyes: Negative.   Respiratory: Negative.    Cardiovascular: Negative.   Endocrine: Negative.   Musculoskeletal: Negative.   Neurological: Negative.   Hematological: Negative.   Psychiatric/Behavioral: Negative.    All other systems reviewed and are negative.   Objective: Vital Signs: There were no vitals taken for this visit.  Physical Exam Vitals and nursing note reviewed.  Constitutional:      Appearance: She is well-developed.  HENT:     Head: Atraumatic.     Nose: Nose normal.  Eyes:     Extraocular Movements: Extraocular movements intact.  Cardiovascular:     Pulses: Normal pulses.  Pulmonary:     Effort: Pulmonary effort is normal.  Abdominal:     Palpations: Abdomen is soft.  Musculoskeletal:     Cervical back: Neck supple.  Skin:    General: Skin is warm.     Capillary Refill: Capillary refill takes less than 2 seconds.  Neurological:     Mental Status: She is alert. Mental status is at baseline.  Psychiatric:        Behavior: Behavior normal.        Thought Content: Thought content normal.        Judgment: Judgment normal.   Ortho Exam  Specialty Comments:  No specialty comments available.  Imaging: No results  found.   PMFS History: Patient Active Problem List   Diagnosis Date Noted  . Difficulty sleeping 05/18/2022  . Prediabetes 04/13/2022  . Other fatigue 01/17/2022  . SOB (shortness of breath) 01/17/2022  . Depression screening 01/17/2022  . OSA (obstructive sleep apnea) 11/11/2021  . Pure hypercholesterolemia 08/11/2021  . Snoring 08/11/2021  . Bilateral primary osteoarthritis of knee 06/16/2021  . Body mass index 45.0-49.9, adult (HCC) 06/16/2021  . GERD (gastroesophageal reflux disease) 04/20/2019  . Essential hypertension 04/20/2019  . Bilateral hip pain 05/09/2018  . Body mass index 40.0-44.9, adult (HCC) 05/09/2018  . Class 3 severe obesity with serious comorbidity and body mass index (BMI) of 45.0 to 49.9 in adult (HCC) 05/09/2018  . Chronic pain of both knees 08/17/2017   Past Medical History:  Diagnosis Date  . ADD (attention deficit disorder)   . Arthritis of both knees   . Cancer [facility])    Colon Cancer adn polyps  . Edema of both lower extremities   . Essential hypertension 04/20/2019  . GERD (gastroesophageal reflux disease) 04/20/2019  . History of hysterectomy    patrial hysterectomy  . Hyperlipidemia   . Hypothyroidism (acquired)    Multiple thyroid nodules  . Joint pain   .  OSA (obstructive sleep apnea) 11/11/2021  . Pure hypercholesterolemia 08/11/2021  . Snoring 08/11/2021  . Thyroid disease    Hypothyroidism  . Vertigo     Family History  Problem Relation Age of Onset  . Thyroid disease Mother   . Hearing loss Mother   . Glaucoma Mother   . Hyperlipidemia Mother   . High Cholesterol Mother   . Sleep apnea Mother   . Colon cancer Maternal Grandmother     Past Surgical History:  Procedure Laterality Date  . ABDOMINAL HYSTERECTOMY    . CESAREAN SECTION     Social History   Occupational History  . Occupation: Special educational needs teacher  Tobacco Use  . Smoking status: Never  . Smokeless tobacco: Never  Substance and Sexual Activity  .  Alcohol use: No  . Drug use: No  . Sexual activity: Not on file

## 2023-04-17 ENCOUNTER — Other Ambulatory Visit (HOSPITAL_BASED_OUTPATIENT_CLINIC_OR_DEPARTMENT_OTHER): Payer: Self-pay

## 2023-04-17 ENCOUNTER — Ambulatory Visit (INDEPENDENT_AMBULATORY_CARE_PROVIDER_SITE_OTHER): Payer: 59 | Admitting: Sports Medicine

## 2023-04-17 ENCOUNTER — Other Ambulatory Visit: Payer: Self-pay

## 2023-04-17 ENCOUNTER — Ambulatory Visit (INDEPENDENT_AMBULATORY_CARE_PROVIDER_SITE_OTHER): Payer: 59

## 2023-04-17 ENCOUNTER — Ambulatory Visit: Payer: 59 | Admitting: Orthopaedic Surgery

## 2023-04-17 ENCOUNTER — Encounter: Payer: Self-pay | Admitting: Sports Medicine

## 2023-04-17 DIAGNOSIS — M1611 Unilateral primary osteoarthritis, right hip: Secondary | ICD-10-CM | POA: Insufficient documentation

## 2023-04-17 DIAGNOSIS — Z6841 Body Mass Index (BMI) 40.0 and over, adult: Secondary | ICD-10-CM | POA: Diagnosis not present

## 2023-04-17 MED ORDER — METHYLPREDNISOLONE ACETATE 40 MG/ML IJ SUSP
80.0000 mg | INTRAMUSCULAR | Status: AC | PRN
Start: 2023-04-17 — End: 2023-04-17
  Administered 2023-04-17: 80 mg via INTRA_ARTICULAR

## 2023-04-17 MED ORDER — LIDOCAINE HCL 1 % IJ SOLN
4.0000 mL | INTRAMUSCULAR | Status: AC | PRN
Start: 2023-04-17 — End: 2023-04-17
  Administered 2023-04-17: 4 mL

## 2023-04-17 MED ORDER — CELECOXIB 200 MG PO CAPS
200.0000 mg | ORAL_CAPSULE | Freq: Two times a day (BID) | ORAL | 3 refills | Status: DC
  Filled 2023-04-17: qty 30, 15d supply, fill #0
  Filled 2023-06-04: qty 30, 15d supply, fill #1
  Filled 2023-07-25: qty 30, 15d supply, fill #2
  Filled 2023-08-17: qty 30, 15d supply, fill #3

## 2023-04-17 NOTE — Progress Notes (Signed)
   Procedure Note  Patient: Lucendia Leard             Date of Birth: 09/20/1960           MRN: [id number]             Visit Date: 04/17/2023  Procedures: Visit Diagnoses:  1. Primary osteoarthritis of right hip    Large Joint Inj: R hip joint on 04/17/2023 8:33 AM Indications: pain Details: 22 G 3.5 in needle, ultrasound-guided anterior approach Medications: 4 mL lidocaine 1 %; 80 mg methylPREDNISolone acetate 40 MG/ML Outcome: tolerated well, no immediate complications  Procedure: US-guided intra-articular hip injection, Right After discussion on risks/benefits/indications and informed verbal consent was obtained, a timeout was performed. Patient was lying supine on exam table. The hip was cleaned with betadine and alcohol swabs. Then utilizing ultrasound guidance, the patient's femoral head and neck junction was identified and subsequently injected with 4:2 lidocaine:depomedrol via an in-plane approach with ultrasound visualization of the injectate administered into the hip joint. Patient tolerated procedure well without immediate complications.  Procedure, treatment alternatives, risks and benefits explained, specific risks discussed. Consent was given by the patient. Immediately prior to procedure a time out was called to verify the correct patient, procedure, equipment, support staff and site/side marked as required. Patient was prepped and draped in the usual sterile fashion.     - follow-up with Dr. Roda Shutters as indicated; I am happy to see them as needed  Madelyn Brunner, DO Primary Care Sports Medicine Physician  [facility]  This note was dictated using Dragon naturally speaking software and may contain errors in syntax, spelling, or content which have not been identified prior to signing this note.

## 2023-05-22 ENCOUNTER — Other Ambulatory Visit (HOSPITAL_BASED_OUTPATIENT_CLINIC_OR_DEPARTMENT_OTHER): Payer: Self-pay

## 2023-05-22 MED ORDER — OSELTAMIVIR PHOSPHATE 75 MG PO CAPS
75.0000 mg | ORAL_CAPSULE | Freq: Two times a day (BID) | ORAL | 0 refills | Status: DC
  Filled 2023-05-22: qty 10, 5d supply, fill #0

## 2023-05-22 MED ORDER — COMPACT SPACE CHAMBER/LG MASK DEVI
0 refills | Status: AC
  Filled 2023-05-23: qty 1, 1d supply, fill #0

## 2023-05-22 MED ORDER — ALBUTEROL SULFATE HFA 108 (90 BASE) MCG/ACT IN AERS
2.0000 | INHALATION_SPRAY | Freq: Four times a day (QID) | RESPIRATORY_TRACT | 0 refills | Status: DC | PRN
  Filled 2023-05-22: qty 6.7, 25d supply, fill #0

## 2023-05-23 ENCOUNTER — Other Ambulatory Visit: Payer: Self-pay

## 2023-05-23 ENCOUNTER — Other Ambulatory Visit (HOSPITAL_BASED_OUTPATIENT_CLINIC_OR_DEPARTMENT_OTHER): Payer: Self-pay

## 2023-06-05 ENCOUNTER — Other Ambulatory Visit (HOSPITAL_BASED_OUTPATIENT_CLINIC_OR_DEPARTMENT_OTHER): Payer: Self-pay

## 2023-06-19 ENCOUNTER — Other Ambulatory Visit: Payer: Self-pay | Admitting: Family Medicine

## 2023-06-19 ENCOUNTER — Encounter: Payer: Self-pay | Admitting: Family Medicine

## 2023-06-19 DIAGNOSIS — Z1231 Encounter for screening mammogram for malignant neoplasm of breast: Secondary | ICD-10-CM

## 2023-06-29 ENCOUNTER — Ambulatory Visit
Admission: RE | Admit: 2023-06-29 | Discharge: 2023-06-29 | Disposition: A | Discharge status: Home / Self Care | Source: Ambulatory Visit | Attending: Family Medicine | Admitting: Family Medicine

## 2023-06-29 DIAGNOSIS — Z1231 Encounter for screening mammogram for malignant neoplasm of breast: Secondary | ICD-10-CM

## 2023-08-17 ENCOUNTER — Other Ambulatory Visit (HOSPITAL_BASED_OUTPATIENT_CLINIC_OR_DEPARTMENT_OTHER): Payer: Self-pay

## 2023-08-17 MED ORDER — VALACYCLOVIR HCL 1 G PO TABS
1000.0000 mg | ORAL_TABLET | Freq: Two times a day (BID) | ORAL | 0 refills | Status: AC
  Filled 2023-08-17: qty 20, 10d supply, fill #0

## 2023-08-28 ENCOUNTER — Other Ambulatory Visit: Payer: BC Managed Care – PPO

## 2023-09-16 IMAGING — MG MM DIGITAL SCREENING BILAT W/ TOMO AND CAD
6 of 10 series · 6 of 30 positions shown · non-contrast
Comparison: Previous exam(s).

ACR Breast Density Category a: The breast tissue is almost entirely
fatty.

CLINICAL DATA: Screening.

EXAM:
DIGITAL SCREENING BILATERAL MAMMOGRAM WITH TOMOSYNTHESIS AND CAD
TECHNIQUE: Bilateral screening digital craniocaudal and mediolateral oblique
mammograms were obtained. Bilateral screening digital breast
tomosynthesis was performed. The images were evaluated with
computer-aided detection.

[R CC synth-2D]
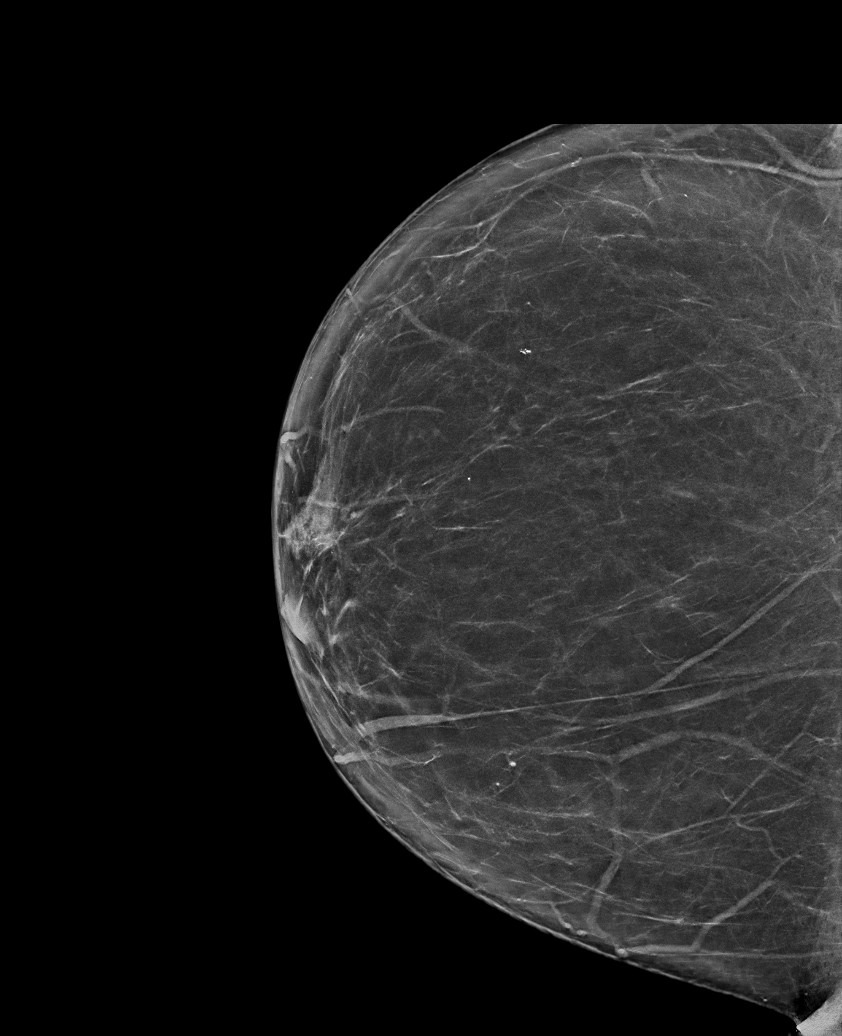

[L CC synth-2D]
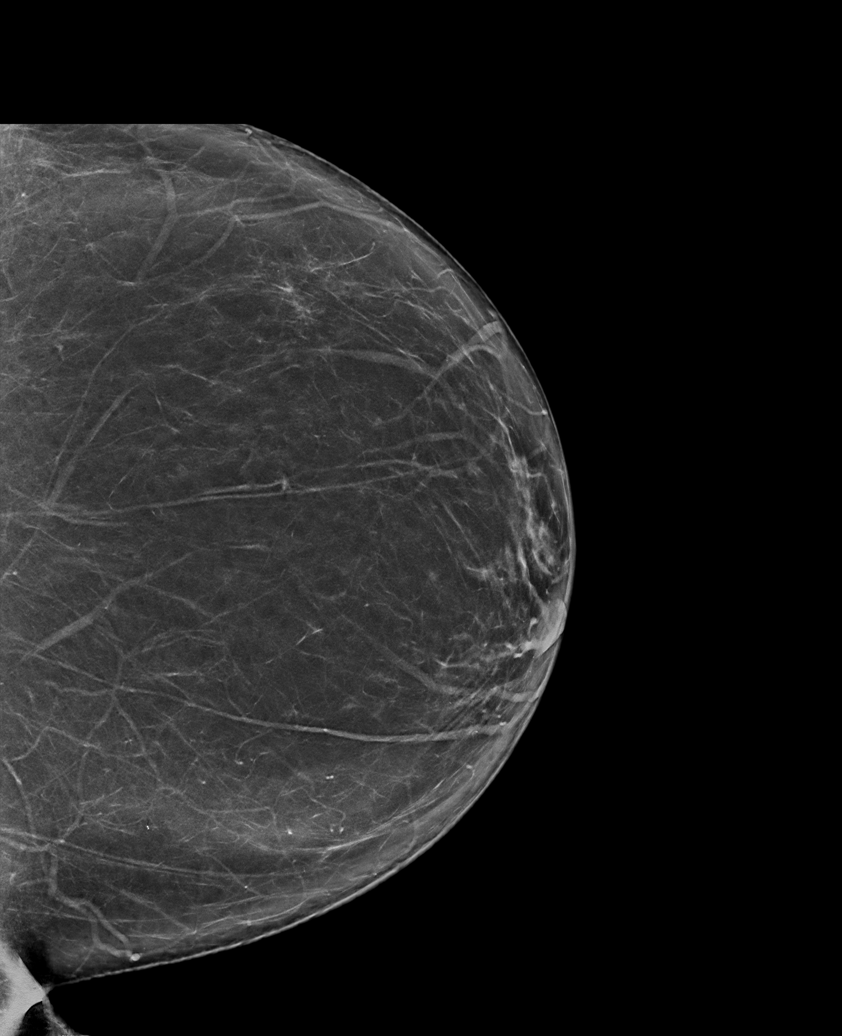

[L MLO synth-2D]
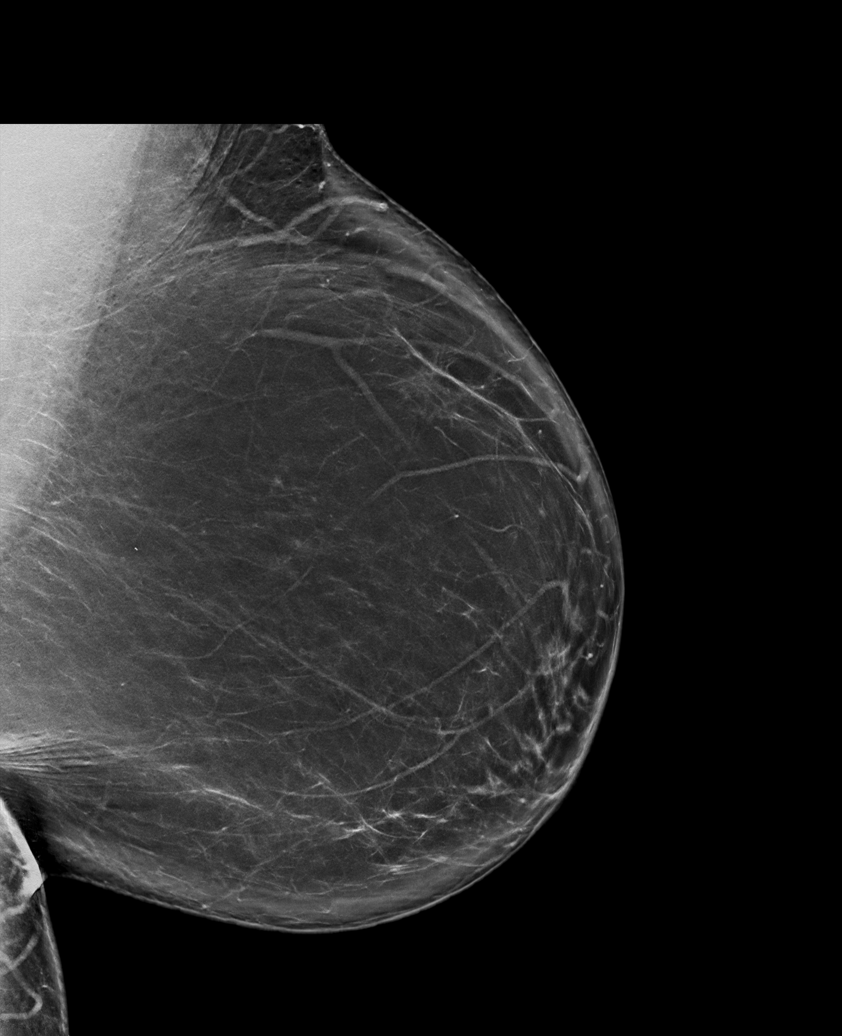

[R MLO synth-2D (1 of 2)]
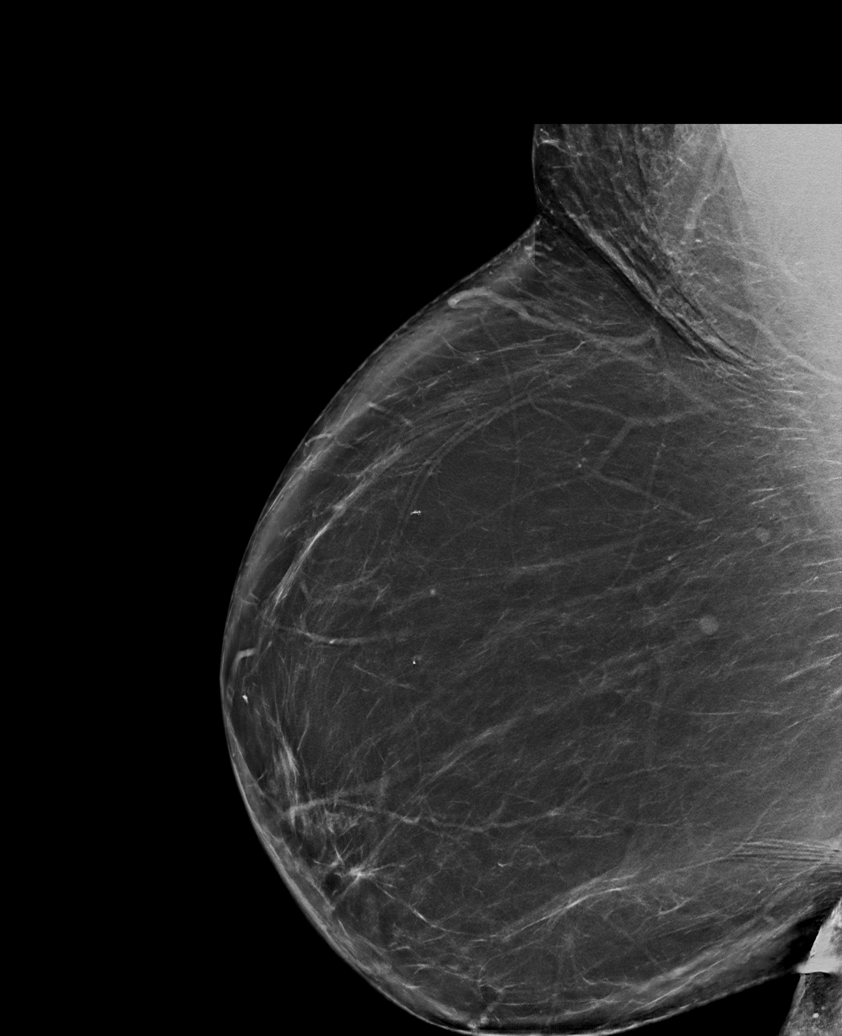

[R MLO synth-2D (2 of 2)]
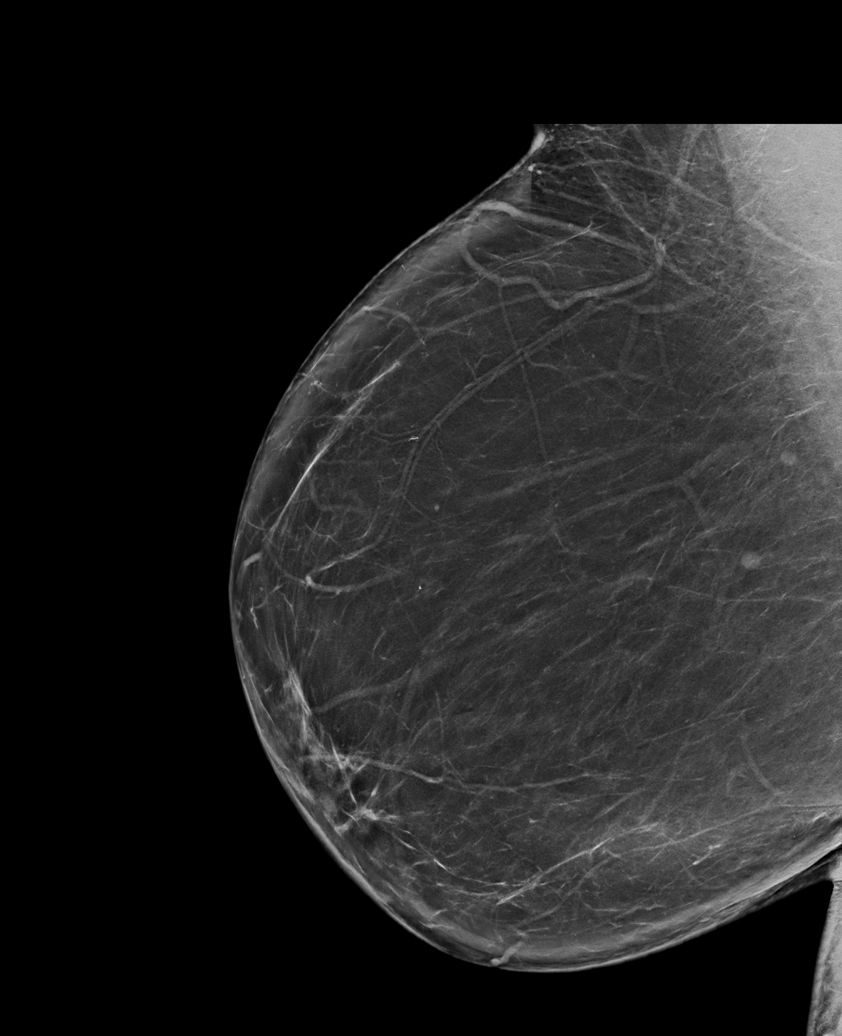

[L CC tomo · tomo slice 44/87.0]
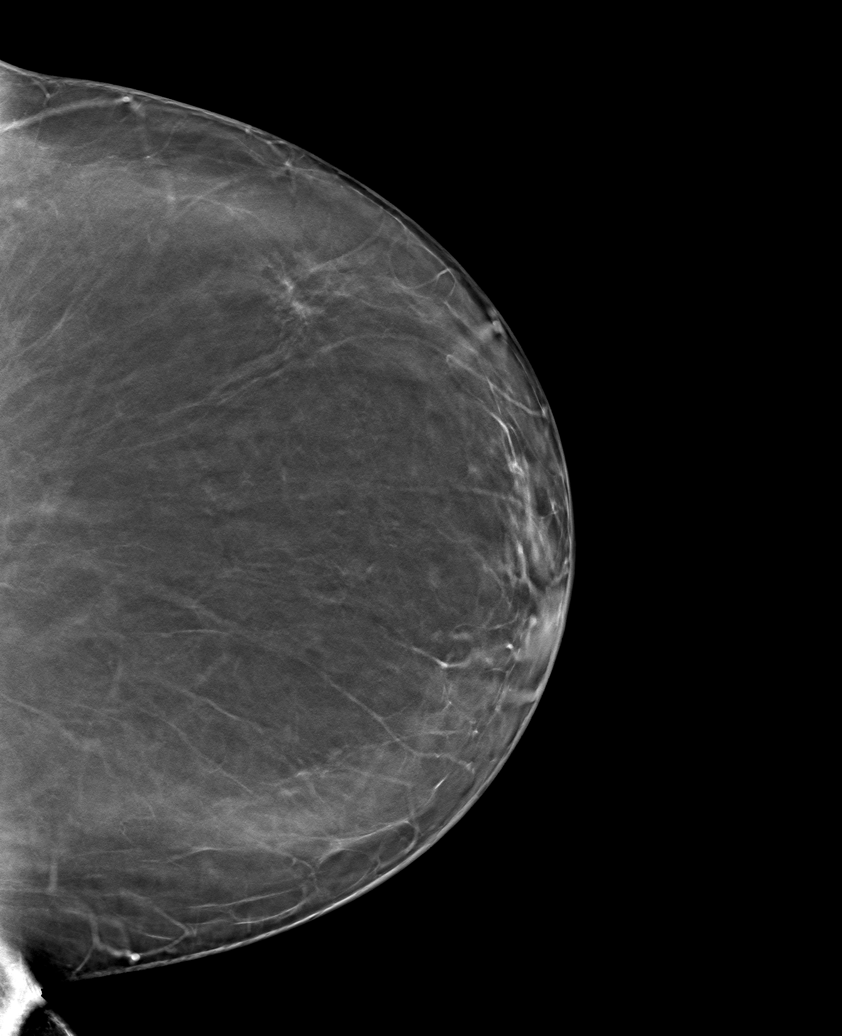

[6 of 30 positions shown; findings below may reference images not displayed]

FINDINGS: There are no findings suspicious for malignancy.
IMPRESSION: No mammographic evidence of malignancy. A result letter of this
screening mammogram will be mailed directly to the patient.

RECOMMENDATION:
Screening mammogram in one year. (Code:0E-3-N98)

BI-RADS CATEGORY  1: Negative.

## 2023-12-28 ENCOUNTER — Other Ambulatory Visit (HOSPITAL_BASED_OUTPATIENT_CLINIC_OR_DEPARTMENT_OTHER): Payer: Self-pay | Admitting: Cardiovascular Disease

## 2024-01-01 ENCOUNTER — Telehealth: Payer: Self-pay

## 2024-01-01 NOTE — Telephone Encounter (Signed)
   Name: Samantha [name]  DOB: [birth year]  MRN: [id number]  Primary Cardiologist: Annabella Scarce, MD  Chart reviewed as part of pre-operative protocol coverage. Because of [name]'s past medical history and time since last visit, she will require a follow-up in-office visit in order to better assess preoperative cardiovascular risk.  Pre-op covering staff: - Please schedule appointment and call patient to inform them. If patient already had an upcoming appointment within acceptable timeframe, please add pre-op clearance to the appointment notes so provider is aware. - Please contact requesting surgeon's office via preferred method (i.e, phone, fax) to inform them of need for appointment prior to surgery.  Aspirin guidance can be given at the time of appointment.  Orren LOISE Fabry, PA-C  01/01/2024, 12:11 PM

## 2024-01-01 NOTE — Telephone Encounter (Signed)
   Pre-operative Risk Assessment    Patient Name: Samantha [name]  DOB: 1960/10/12 MRN: [id number]   Date of last office visit: 03/29/2022 Date of next office visit:    Request for Surgical Clearance    Procedure:  right total hip replacement   Date of Surgery:  Clearance TBD                                 Surgeon:  Dr. Wilder Socks Group or Practice Name:  [facility]  Phone number:  [phone] Fax number:  [phone]   Type of Clearance Requested:   - Medical  - Pharmacy:  Hold Aspirin not indicated    Type of Anesthesia:  Spinal   Additional requests/questions:    SignedRebeca Blight   01/01/2024, 11:36 AM

## 2024-01-01 NOTE — Telephone Encounter (Signed)
 Patient has been made an appointment with APP Scot Ford, PA for this week

## 2024-01-03 ENCOUNTER — Ambulatory Visit: Admitting: Physician Assistant

## 2024-01-08 ENCOUNTER — Encounter (HOSPITAL_BASED_OUTPATIENT_CLINIC_OR_DEPARTMENT_OTHER): Payer: Self-pay

## 2024-01-09 NOTE — Progress Notes (Unsigned)
 Cardiology Office Note   Date:  01/10/2024  ID:  Makita, Blow 10-Apr-1960, MRN [id number] PCP: Douglass Ivanoff, MD  [facility] Providers Cardiologist:  Annabella Scarce, MD   History of Present Illness Samantha [name] is a 63 y.o. female with a past medical history of GERD, hypertension, hyperlipidemia, OSA on CPAP, and obesity who is here for follow-up appointment.  Was seen initially 2018 with atypical chest pain and shortness of breath.  Patient was referred for echo that was unremarkable.  Also had ETT that showed poor exercise tolerance.  Started on amlodipine .  However this was stopped due to increased lower extremity edema.  Has venous insufficiency as well with laser treatment 03/24/2018.  She noted some skin discoloration.  She wears compression socks and follows up with Dr. Audrey at [facility].  She was started on HCTZ due to poor control of BP.  Previously on statins and had a lot of stiffness.  Took Repatha  as well and tolerated it well.  In the interim it was discontinued and she was told to start on Praluent .  She never filled the prescription.  She has been tolerating pravastatin well.  We deferred the prep milligram at [facility].  She had a sleep study and was started on CPAP 09/2021.  Last visit her blood pressure was well-controlled in the office but above goal at home.  She purchased a new machine and was referred to Pharm.D. to start on semaglutide .  Continue working with [facility].  When she was last seen in January 2024 by Dr. Scarce she had been doing well managing her sleep apnea and weight loss efforts.  She reported using CPAP but experiencing consistent breast quality and admits not understanding the app readings associated with the device.  Enjoys participating in healthy weight and wellness and found that Wegovy  was helpful.  She did hold it over the holidays while she was sick.  Wonders if she  should resume taking.  She is committed to physical activity going to membership and reporting improvement in knee arthritis following an injection from her orthopedic doctor.  Self monitoring her blood pressure at home and she has noticed some fluctuations.  In January she reported 123/77 and 124/77 with an increase in blood pressure starting in January with 128/77, 127/77, 135/77, and 140/77.  She inquired about the potential impact of coffee and pain medicine on her blood pressure.  Been taking ibuprofen for knee pain and is on an unspecified allergy medication.  Today, she presents with hypertension and coronary artery disease for cardiovascular evaluation and CPAP management.  She has not used her CPAP machine for six to seven months due to discomfort from a new mask and technical issues with the machine. Attempts to resolve these issues with [employer] have been unsuccessful. Hypertension is managed with hydrochlorothiazide  and Crestor . She takes Crestor  20 mg for cholesterol and a baby aspirin for coronary artery disease. There are no current chest pain, shortness of breath, or heart-related symptoms. She has lost approximately 22 pounds since her last visit due to dietary changes, which have also improved her acid reflux. She is preparing for right hip surgery in November and is here for pre-operative clearance. Current medications include hydrochlorothiazide , Crestor , baby aspirin, Synthroid  88 mcg, and magnesium glycinate.  Reports no shortness of breath nor dyspnea on exertion. Reports no chest pain, pressure, or tightness. No edema, orthopnea, PND. Reports no palpitations.   Discussed the use of  AI scribe software for clinical note transcription with the patient, who gave verbal consent to proceed.   ROS: Pertinent ROS in HPI  Studies Reviewed      No recent testing      Physical Exam VS:  BP 112/76   Pulse 60   Ht 5' 3 (1.6 m)   Wt 222 lb 3.2 oz (100.8 kg)   SpO2 98%   BMI 39.36  kg/m        Wt Readings from Last 3 Encounters:  01/10/24 222 lb 3.2 oz (100.8 kg)  06/27/22 236 lb (107 kg)  06/05/22 240 lb (108.9 kg)    GEN: Well nourished, well developed in no acute distress NECK: No JVD; No carotid bruits CARDIAC: RRR, no murmurs, rubs, gallops RESPIRATORY:  Clear to auscultation without rales, wheezing or rhonchi  ABDOMEN: Soft, non-tender, non-distended EXTREMITIES:  No edema; No deformity   ASSESSMENT AND PLAN Preop clearance  [name]'s perioperative risk of a major cardiac event is 0.9% according to the Revised Cardiac Risk Index (RCRI).  Therefore, she is at low risk for perioperative complications.   Her functional capacity is good at 6.55 METs according to the Duke Activity Status Index (DASI). Recommendations: According to ACC/AHA guidelines, no further cardiovascular testing needed.  The patient may proceed to surgery at acceptable risk.   Antiplatelet and/or Anticoagulation Recommendations: Aspirin can be held for 5-7 days prior to her surgery.  Please resume Aspirin post operatively when it is felt to be safe from a bleeding standpoint.   Atherosclerotic coronary artery disease without angina Well-managed without current angina symptoms. EKG shows normal sinus rhythm with heart rate in the 50s. - Continue baby aspirin for coronary artery disease prevention. - No further cardiac workup needed at this time.  Essential hypertension with lower extremity edema Hypertension well-controlled with current medication regimen. Experiences lower extremity edema, managed with hydrochlorothiazide . - Continue hydrochlorothiazide  for blood pressure and edema management. - Advise on use of compression stockings for edema. - Monitor blood pressure at home and consider dose adjustment if consistently low.  Pure hypercholesterolemia Cholesterol management with Crestor  20 mg ongoing. Labs due to assess current cholesterol levels. - Order fasting labs for  cholesterol assessment. - Continue Crestor  20 mg daily.  Obstructive sleep apnea with CPAP nonadherence Nonadherent with CPAP therapy for 6-7 months due to discomfort and technical issues. Troubleshooting necessary to ensure effective treatment and reduce risk of heart arrhythmias. - Refer to Dr. Shlomo for sleep medicine consultation. - Contact nurse navigator Brad to troubleshoot CPAP issues. - Consider alternative CPAP mask or device options.  Obesity due to excess calories Lost 22 pounds since last visit, indicating progress in weight management. Continued weight loss encouraged to improve overall health and potentially reduce medication needs. - Encourage continued weight loss efforts. - Monitor weight and adjust treatment plan as needed.  Right hip osteoarthritis Chronic right hip osteoarthritis causing significant discomfort and limiting physical activity. Preparing for hip surgery in November. - Complete pre-operative clearance for hip surgery. - Order labs for cholesterol, electrolytes, and kidney function. - Advise to continue with current physical activity as tolerated.  Hypothyroidism Managed with Synthroid  (levothyroxine ) 88 mcg. Thyroid  function important for heart rhythm management. - Continue Synthroid  88 mcg daily.    Dispo: She can return to see Dr. Raford in 6 months, Dr[facility]lomo [facility].  Signed, Orren LOISE Fabry, PA-C

## 2024-01-10 ENCOUNTER — Ambulatory Visit: Attending: Internal Medicine | Admitting: Physician Assistant

## 2024-01-10 ENCOUNTER — Encounter: Payer: Self-pay | Admitting: Physician Assistant

## 2024-01-10 VITALS — BP 112/76 | HR 60 | Ht 63.0 in | Wt 222.2 lb

## 2024-01-10 DIAGNOSIS — G4733 Obstructive sleep apnea (adult) (pediatric): Secondary | ICD-10-CM | POA: Diagnosis not present

## 2024-01-10 DIAGNOSIS — E7800 Pure hypercholesterolemia, unspecified: Secondary | ICD-10-CM

## 2024-01-10 DIAGNOSIS — I10 Essential (primary) hypertension: Secondary | ICD-10-CM | POA: Diagnosis not present

## 2024-01-10 DIAGNOSIS — R5383 Other fatigue: Secondary | ICD-10-CM

## 2024-01-10 NOTE — Patient Instructions (Signed)
 Medication Instructions:  Your physician recommends that you continue on your current medications as directed. Please refer to the Current Medication list given to you today. *If you need a refill on your cardiac medications before your next appointment, please call your pharmacy*  Lab Work: TODAY-LIPIDS & CMET If you have labs (blood work) drawn today and your tests are completely normal, you will receive your results only by: MyChart Message (if you have MyChart) OR A paper copy in the mail If you have any lab test that is abnormal or we need to change your treatment, we will call you to review the results.  Testing/Procedures: NONE ORDERED  Follow-Up: At [facility], you and your health needs are our priority.  As part of our continuing mission to provide you with exceptional heart care, our providers are all part of one team.  This team includes your primary Cardiologist (physician) and Advanced Practice Providers or APPs (Physician Assistants and Nurse Practitioners) who all work together to provide you with the care you need, when you need it.  Your next appointment:   6 month(s)  Provider:   Annabella Scarce, MD    You have been referred to SLEEP WITH DR TURNER (OLD DR BURNARD PATIENT) NEEDS FIRST AVAILABLE WITH DR SHLOMO   We recommend signing up for the patient portal called MyChart.  Sign up information is provided on this After Visit Summary.  MyChart is used to connect with patients for Virtual Visits (Telemedicine).  Patients are able to view lab/test results, encounter notes, upcoming appointments, etc.  Non-urgent messages can be sent to your provider as well.   To learn more about what you can do with MyChart, go to [email or web address].   Other Instructions

## 2024-01-11 LAB — COMPREHENSIVE METABOLIC PANEL WITH GFR
ALT: 18 IU/L (ref 0–32)
AST: 24 IU/L (ref 0–40)
Albumin: 4.1 g/dL (ref 3.9–4.9)
Alkaline Phosphatase: 80 IU/L (ref 49–135)
BUN/Creatinine Ratio: 13 (ref 12–28)
BUN: 9 mg/dL (ref 8–27)
Bilirubin Total: 0.3 mg/dL (ref 0.0–1.2)
CO2: 26 mmol/L (ref 20–29)
Calcium: 9.4 mg/dL (ref 8.7–10.3)
Chloride: 100 mmol/L (ref 96–106)
Creatinine, Ser: 0.7 mg/dL (ref 0.57–1.00)
Globulin, Total: 2.8 g/dL (ref 1.5–4.5)
Glucose: 91 mg/dL (ref 70–99)
Potassium: 4.1 mmol/L (ref 3.5–5.2)
Sodium: 141 mmol/L (ref 134–144)
Total Protein: 6.9 g/dL (ref 6.0–8.5)
eGFR: 96 mL/min/1.73 (ref >59)

## 2024-01-11 LAB — LIPID PANEL
Chol/HDL Ratio: 2.4 ratio (ref 0.0–4.4)
Cholesterol, Total: 166 mg/dL (ref 100–199)
HDL: 68 mg/dL (ref >39)
LDL Chol Calc (NIH): 86 mg/dL (ref 0–99)
Triglycerides: 59 mg/dL (ref 0–149)
VLDL Cholesterol Cal: 12 mg/dL (ref 5–40)

## 2024-01-17 ENCOUNTER — Ambulatory Visit: Payer: Self-pay | Admitting: Physician Assistant

## 2024-01-21 ENCOUNTER — Encounter: Payer: Self-pay | Admitting: Radiology

## 2024-01-22 ENCOUNTER — Other Ambulatory Visit: Payer: Self-pay

## 2024-01-24 MED ORDER — ROSUVASTATIN CALCIUM 20 MG PO TABS: 20.0000 mg | ORAL_TABLET | Freq: Every day | ORAL | 0 refills | Status: DC

## 2024-01-29 NOTE — Progress Notes (Signed)
 Sent message, via epic in basket, requesting orders in epic from Careers adviser.

## 2024-02-01 NOTE — Patient Instructions (Signed)
 SURGICAL WAITING ROOM VISITATION Patients having surgery or a procedure may have no more than 2 support people in the waiting area - these visitors may rotate in the visitor waiting room.   Due to an increase in RSV and influenza rates and associated hospitalizations, children ages 34 and under may not visit patients in [facility] hospitals. If the patient needs to stay at the hospital during part of their recovery, the visitor guidelines for inpatient rooms apply.  PRE-OP VISITATION  Pre-op nurse will coordinate an appropriate time for 1 support person to accompany the patient in pre-op.  This support person may not rotate.  This visitor will be contacted when the time is appropriate for the visitor to come back in the pre-op area.  Please refer to the [facility] website for the visitor guidelines for Inpatients (after your surgery is over and you are in a regular room).  You are not required to quarantine at this time prior to your surgery. However, you must do this: Hand Hygiene often Do NOT share personal items Notify your provider if you are in close contact with someone who has COVID or you develop fever 100.4 or greater, new onset of sneezing, cough, sore throat, shortness of breath or body aches.  If you test positive for Covid or have been in contact with anyone that has tested positive in the last 10 days please notify you surgeon.    Your procedure is scheduled on: 02/18/24   Report to [facility] Main Entrance: [place] entrance where the [employer] is available.   Report to admitting at: 10:20 AM  Call this number if you have any questions or problems the morning of surgery [phone]  FOLLOW ANY ADDITIONAL PRE OP INSTRUCTIONS YOU RECEIVED FROM YOUR SURGEON'S OFFICE!!!  Do not eat food after Midnight the night prior to your surgery/procedure.  After Midnight you may have the following liquids until: 9:50 AM DAY OF SURGERY  Clear Liquid Diet Water Black  Coffee (sugar ok, NO MILK/CREAM OR CREAMERS)  Tea (sugar ok, NO MILK/CREAM OR CREAMERS) regular and decaf                             Plain Jell-O  with no fruit (NO RED)                                           Fruit ices (not with fruit pulp, NO RED)                                     Popsicles (NO RED)                                                                  Juice: NO CITRUS JUICES: only apple, WHITE grape, WHITE cranberry Sports drinks like Gatorade or Powerade (NO RED)   The day of surgery:  Drink ONE (1) Pre-Surgery Clear G2 at : 9:50 AM the morning of surgery. Drink in one sitting. Do not sip.  This drink was given  to you during your hospital pre-op appointment visit. Nothing else to drink after completing the Pre-Surgery Clear Ensure or G2 : No candy, chewing gum or throat lozenges.    Oral Hygiene is also important to reduce your risk of infection.        Remember - BRUSH YOUR TEETH THE MORNING OF SURGERY WITH YOUR REGULAR TOOTHPASTE  Do NOT smoke after Midnight the night before surgery.  STOP TAKING all Vitamins, Herbs and supplements 1 week before your surgery.   Take ONLY these medicines the morning of surgery with A SIP OF WATER: levothyroxine .Tylenol  as needed.Use inhalers as usual.  DO NOT TAKE THE FOLLOWING 7 DAYS PRIOR TO SURGERY: Ozempic , Wegovy , Rybelsus  (Semaglutide ), Byetta (exenatide), Bydureon (exenatide ER), Victoza, Saxenda (liraglutide), or Trulicity (dulaglutide) Mounjaro (Tirzepatide) Adlyxin (Lixisenatide), Polyethylene Glycol Loxenatide.HOLD semaglutide  after: 02/10/24  If You have been diagnosed with Sleep Apnea - Bring CPAP mask and tubing day of surgery. We will provide you with a CPAP machine on the day of your surgery.                   You may not have any metal on your body including hair pins, jewelry, and body piercing  Do not wear make-up, lotions, powders, perfumes / cologne, or deodorant  Do not wear nail polish including gel and S&S,  artificial / acrylic nails, or any other type of covering on natural nails including finger and toenails. If you have artificial nails, gel coating, etc., that needs to be removed by a nail salon, Please have this removed prior to surgery. Not doing so may mean that your surgery could be cancelled or delayed if the Surgeon or anesthesia staff feels like they are unable to monitor you safely.   Do not shave 48 hours prior to surgery to avoid nicks in your skin which may contribute to postoperative infections.   Contacts, Hearing Aids, dentures or bridgework may not be worn into surgery. DENTURES WILL BE REMOVED PRIOR TO SURGERY PLEASE DO NOT APPLY Poly grip OR ADHESIVES!!!  You may bring a small overnight bag with you on the day of surgery, only pack items that are not valuable. [facility] IS NOT RESPONSIBLE   FOR VALUABLES THAT ARE LOST OR STOLEN.   Patients discharged on the day of surgery will not be allowed to drive home.  Someone NEEDS to stay with you for the first 24 hours after anesthesia.  Do not bring your home medications to the hospital. The Pharmacy will dispense medications listed on your medication list to you during your admission in the Hospital.  Special Instructions: Bring a copy of your healthcare power of attorney and living will documents the day of surgery, if you wish to have them scanned into your [facility] Records- EPIC  Please read over the following fact sheets you were given: IF YOU HAVE QUESTIONS ABOUT YOUR PRE-OP INSTRUCTIONS, PLEASE CALL [phone]  PATIENT SIGNATURE_________________________________  NURSE SIGNATURE__________________________________  ________________________________________________________________________  Pre-operative 4 CHG Bath Instructions  DYNA-Hex 4 Chlorhexidine Gluconate 4% Solution Antiseptic 4 fl. oz   You can play a key role in reducing the risk of infection after surgery. Your skin needs to be as free of germs as  possible. You can reduce the number of germs on your skin by washing with CHG (chlorhexidine gluconate) soap before surgery. CHG is an antiseptic soap that kills germs and continues to kill germs even after washing.   DO NOT use if you have an allergy to chlorhexidine/CHG  or antibacterial soaps. If your skin becomes reddened or irritated, stop using the CHG and notify one of our RNs at   Please shower with the CHG soap starting 4 days before surgery using the following schedule:     Please keep in mind the following:  DO NOT shave, including legs and underarms, starting the day of your first shower.   You may shave your face at any point before/day of surgery.  Place clean sheets on your bed the day you start using CHG soap. Use a clean washcloth (not used since being washed) for each shower. DO NOT sleep with pets once you start using the CHG.  CHG Shower Instructions:  If you choose to wash your hair and private area, wash first with your normal shampoo/soap.  After you use shampoo/soap, rinse your hair and body thoroughly to remove shampoo/soap residue.  Turn the water OFF and apply about 3 tablespoons (45 ml) of CHG soap to a CLEAN washcloth.  Apply CHG soap ONLY FROM YOUR NECK DOWN TO YOUR TOES (washing for 3-5 minutes)  DO NOT use CHG soap on face, private areas, open wounds, or sores.  Pay special attention to the area where your surgery is being performed.  If you are having back surgery, having someone wash your back for you may be helpful. Wait 2 minutes after CHG soap is applied, then you may rinse off the CHG soap.  Pat dry with a clean towel  Put on clean clothes/pajamas   If you choose to wear lotion, please use ONLY the CHG-compatible lotions on the back of this paper.     Additional instructions for the day of surgery: DO NOT APPLY any lotions, deodorants, cologne, or perfumes.   Put on clean/comfortable clothes.  Brush your teeth.  Ask your nurse before applying any  prescription medications to the skin.   CHG Compatible Lotions   Aveeno Moisturizing lotion  Cetaphil Moisturizing Cream  Cetaphil Moisturizing Lotion  Clairol Herbal Essence Moisturizing Lotion, Dry Skin  Clairol Herbal Essence Moisturizing Lotion, Extra Dry Skin  Clairol Herbal Essence Moisturizing Lotion, Normal Skin  Curel Age Defying Therapeutic Moisturizing Lotion with Alpha Hydroxy  Curel Extreme Care Body Lotion  Curel Soothing Hands Moisturizing Hand Lotion  Curel Therapeutic Moisturizing Cream, Fragrance-Free  Curel Therapeutic Moisturizing Lotion, Fragrance-Free  Curel Therapeutic Moisturizing Lotion, Original Formula  Eucerin Daily Replenishing Lotion  Eucerin Dry Skin Therapy Plus Alpha Hydroxy Crme  Eucerin Dry Skin Therapy Plus Alpha Hydroxy Lotion  Eucerin Original Crme  Eucerin Original Lotion  Eucerin Plus Crme Eucerin Plus Lotion  Eucerin TriLipid Replenishing Lotion  Keri Anti-Bacterial Hand Lotion  Keri Deep Conditioning Original Lotion Dry Skin Formula Softly Scented  Keri Deep Conditioning Original Lotion, Fragrance Free Sensitive Skin Formula  Keri Lotion Fast Absorbing Fragrance Free Sensitive Skin Formula  Keri Lotion Fast Absorbing Softly Scented Dry Skin Formula  Keri Original Lotion  Keri Skin Renewal Lotion Keri Silky Smooth Lotion  Keri Silky Smooth Sensitive Skin Lotion  Nivea Body Creamy Conditioning Oil  Nivea Body Extra Enriched Lotion  Nivea Body Original Lotion  Nivea Body Sheer Moisturizing Lotion Nivea Crme  Nivea Skin Firming Lotion  NutraDerm 30 Skin Lotion  NutraDerm Skin Lotion  NutraDerm Therapeutic Skin Cream  NutraDerm Therapeutic Skin Lotion  ProShield Protective Hand Cream  Provon moisturizing lotion  Incentive Spirometer  An incentive spirometer is a tool that can help keep your lungs clear and active. This tool measures how well you are filling your  lungs with each breath. Taking long deep breaths may help reverse or  decrease the chance of developing breathing (pulmonary) problems (especially infection) following: A long period of time when you are unable to move or be active. BEFORE THE PROCEDURE  If the spirometer includes an indicator to show your best effort, your nurse or respiratory therapist will set it to a desired goal. If possible, sit up straight or lean slightly forward. Try not to slouch. Hold the incentive spirometer in an upright position. INSTRUCTIONS FOR USE  Sit on the edge of your bed if possible, or sit up as far as you can in bed or on a chair. Hold the incentive spirometer in an upright position. Breathe out normally. Place the mouthpiece in your mouth and seal your lips tightly around it. Breathe in slowly and as deeply as possible, raising the piston or the ball toward the top of the column. Hold your breath for 3-5 seconds or for as long as possible. Allow the piston or ball to fall to the bottom of the column. Remove the mouthpiece from your mouth and breathe out normally. Rest for a few seconds and repeat Steps 1 through 7 at least 10 times every 1-2 hours when you are awake. Take your time and take a few normal breaths between deep breaths. The spirometer may include an indicator to show your best effort. Use the indicator as a goal to work toward during each repetition. After each set of 10 deep breaths, practice coughing to be sure your lungs are clear. If you have an incision (the cut made at the time of surgery), support your incision when coughing by placing a pillow or rolled up towels firmly against it. Once you are able to get out of bed, walk around indoors and cough well. You may stop using the incentive spirometer when instructed by your caregiver.  RISKS AND COMPLICATIONS Take your time so you do not get dizzy or light-headed. If you are in pain, you may need to take or ask for pain medication before doing incentive spirometry. It is harder to take a deep breath if you  are having pain. AFTER USE Rest and breathe slowly and easily. It can be helpful to keep track of a log of your progress. Your caregiver can provide you with a simple table to help with this. If you are using the spirometer at home, follow these instructions: SEEK MEDICAL CARE IF:  You are having difficultly using the spirometer. You have trouble using the spirometer as often as instructed. Your pain medication is not giving enough relief while using the spirometer. You develop fever of 100.5 F (38.1 C) or higher. SEEK IMMEDIATE MEDICAL CARE IF:  You cough up bloody sputum that had not been present before. You develop fever of 102 F (38.9 C) or greater. You develop worsening pain at or near the incision site. MAKE SURE YOU:  Understand these instructions. Will watch your condition. Will get help right away if you are not doing well or get worse. Document Released: 07/17/2006 Document Revised: 05/29/2011 Document Reviewed: 09/17/2006 [facility] Patient Information 2014 [place], [place].   ________________________________________________________________________

## 2024-02-05 ENCOUNTER — Ambulatory Visit: Payer: Self-pay | Admitting: Emergency Medicine

## 2024-02-05 ENCOUNTER — Other Ambulatory Visit: Payer: Self-pay

## 2024-02-05 ENCOUNTER — Encounter (HOSPITAL_COMMUNITY): Payer: Self-pay

## 2024-02-05 ENCOUNTER — Encounter (HOSPITAL_COMMUNITY)
Admission: RE | Admit: 2024-02-05 | Discharge: 2024-02-05 | Disposition: A | Discharge status: Home / Self Care | Source: Ambulatory Visit | Attending: Orthopedic Surgery | Admitting: Orthopedic Surgery

## 2024-02-05 VITALS — BP 140/77 | HR 65 | Temp 98.7°F | Ht 63.0 in | Wt 220.0 lb

## 2024-02-05 DIAGNOSIS — G4733 Obstructive sleep apnea (adult) (pediatric): Secondary | ICD-10-CM | POA: Insufficient documentation

## 2024-02-05 DIAGNOSIS — Z01818 Encounter for other preprocedural examination: Secondary | ICD-10-CM

## 2024-02-05 DIAGNOSIS — I2510 Atherosclerotic heart disease of native coronary artery without angina pectoris: Secondary | ICD-10-CM | POA: Insufficient documentation

## 2024-02-05 DIAGNOSIS — G8929 Other chronic pain: Secondary | ICD-10-CM | POA: Diagnosis not present

## 2024-02-05 DIAGNOSIS — Z91199 Patient's noncompliance with other medical treatment and regimen due to unspecified reason: Secondary | ICD-10-CM | POA: Insufficient documentation

## 2024-02-05 DIAGNOSIS — M1611 Unilateral primary osteoarthritis, right hip: Secondary | ICD-10-CM | POA: Diagnosis not present

## 2024-02-05 DIAGNOSIS — E039 Hypothyroidism, unspecified: Secondary | ICD-10-CM | POA: Diagnosis not present

## 2024-02-05 DIAGNOSIS — I10 Essential (primary) hypertension: Secondary | ICD-10-CM | POA: Diagnosis not present

## 2024-02-05 DIAGNOSIS — Z01812 Encounter for preprocedural laboratory examination: Secondary | ICD-10-CM | POA: Diagnosis present

## 2024-02-05 HISTORY — DX: Anemia, unspecified: D64.9

## 2024-02-05 HISTORY — DX: Atherosclerotic heart disease of native coronary artery without angina pectoris: I25.10

## 2024-02-05 LAB — CBC WITH DIFFERENTIAL/PLATELET
Abs Immature Granulocytes: 0.01 K/uL (ref 0.00–0.07)
Basophils Absolute: 0 K/uL (ref 0.0–0.1)
Basophils Relative: 1 %
Eosinophils Absolute: 0.2 K/uL (ref 0.0–0.5)
Eosinophils Relative: 2 %
HCT: 40.1 % (ref 36.0–46.0)
Hemoglobin: 13.3 g/dL (ref 12.0–15.0)
Immature Granulocytes: 0 %
Lymphocytes Relative: 36 %
Lymphs Abs: 2.4 K/uL (ref 0.7–4.0)
MCH: 28.5 pg (ref 26.0–34.0)
MCHC: 33.2 g/dL (ref 30.0–36.0)
MCV: 86.1 fL (ref 80.0–100.0)
Monocytes Absolute: 0.5 K/uL (ref 0.1–1.0)
Monocytes Relative: 7 %
Neutro Abs: 3.6 K/uL (ref 1.7–7.7)
Neutrophils Relative %: 54 %
Platelets: 294 K/uL (ref 150–400)
RBC: 4.66 MIL/uL (ref 3.87–5.11)
RDW: 13.8 % (ref 11.5–15.5)
WBC: 6.7 K/uL (ref 4.0–10.5)
nRBC: 0 % (ref 0.0–0.2)

## 2024-02-05 LAB — COMPREHENSIVE METABOLIC PANEL WITH GFR
ALT: 19 U/L (ref 0–44)
AST: 31 U/L (ref 15–41)
Albumin: 4.4 g/dL (ref 3.5–5.0)
Alkaline Phosphatase: 80 U/L (ref 38–126)
Anion gap: 11 (ref 5–15)
BUN: 13 mg/dL (ref 8–23)
CO2: 29 mmol/L (ref 22–32)
Calcium: 9.9 mg/dL (ref 8.9–10.3)
Chloride: 100 mmol/L (ref 98–111)
Creatinine, Ser: 0.81 mg/dL (ref 0.44–1.00)
GFR, Estimated: >60 mL/min (ref >60)
Glucose, Bld: 93 mg/dL (ref 70–99)
Potassium: 3.9 mmol/L (ref 3.5–5.1)
Sodium: 139 mmol/L (ref 135–145)
Total Bilirubin: 0.3 mg/dL (ref 0.0–1.2)
Total Protein: 8.1 g/dL (ref 6.5–8.1)

## 2024-02-05 LAB — TYPE AND SCREEN
ABO/RH(D): A POS
Antibody Screen: NEGATIVE

## 2024-02-05 LAB — SURGICAL PCR SCREEN
MRSA, PCR: NEGATIVE
Staphylococcus aureus: POSITIVE — AB

## 2024-02-05 NOTE — Progress Notes (Signed)
 For Anesthesia: PCP - Dyane Anthony RAMAN, FNP  Cardiologist - Raford Riggs, MD  Clearance: Lucien Orren SAILOR, PA-C : 01/10/24 Bowel Prep reminder:  Chest x-ray -  EKG - 01/03/24: CEW: requested: Media tab. Stress Test - 03/16/17 ECHO - 06/26/16 Cardiac Cath -  Pacemaker/ICD device last checked: Pacemaker orders received: Device Rep notified:  Spinal Cord Stimulator:N/A  Sleep Study - Yes CPAP - NO  Fasting Blood Sugar - N/A Checks Blood Sugar _____ times a day Date and result of last Hgb A1c-  Last dose of GLP1 agonist-  GLP1 instructions: Hold 7 days prior to schedule (Hold 24 hours-daily)   Last dose of SGLT-2 inhibitors-  SGLT-2 instructions: Hold 72 hours prior to surgery  Blood Thinner Instructions: Last Dose: Time last taken:  Aspirin Instructions:To hold it a week before surgery. Last Dose: Time last taken:  Activity level: Can go up a flight of stairs and activities of daily living without stopping and without chest pain and/or shortness of breath   Able to exercise without chest pain and/or shortness of breath  Anesthesia review: Hx: HTN,OSA (NO CPAP),CAD,Pre-DIA  Patient denies shortness of breath, fever, cough and chest pain at PAT appointment   Patient verbalized understanding of instructions that were reviewed over the telephone.

## 2024-02-05 NOTE — H&P (Signed)
 TOTAL HIP ADMISSION H&P  Patient is admitted for right total hip arthroplasty.  Subjective:  Chief Complaint: right hip pain  HPI: Samantha [name], 63 y.o. female, has a history of pain and functional disability in the right hip(s) due to arthritis and patient has failed non-surgical conservative treatments for greater than 12 weeks to include NSAID's and/or analgesics, corticosteriod injections, use of assistive devices, and activity modification.  Onset of symptoms was gradual starting 2 years ago with gradually worsening course since that time.The patient noted no past surgery on the right hip(s).  Patient currently rates pain in the right hip at 10 out of 10 with activity. Patient has night pain, worsening of pain with activity and weight bearing, pain that interfers with activities of daily living, and pain with passive range of motion. Patient has evidence of periarticular osteophytes and joint space narrowing by imaging studies. This condition presents safety issues increasing the risk of falls.  There is no current active infection.  Patient Active Problem List   Diagnosis Date Noted   Primary osteoarthritis of right hip 04/17/2023   Difficulty sleeping 05/18/2022   Prediabetes 04/13/2022   Other fatigue 01/17/2022   SOB (shortness of breath) 01/17/2022   Depression screening 01/17/2022   OSA (obstructive sleep apnea) 11/11/2021   Pure hypercholesterolemia 08/11/2021   Snoring 08/11/2021   Bilateral primary osteoarthritis of knee 06/16/2021   Body mass index 45.0-49.9, adult (HCC) 06/16/2021   GERD (gastroesophageal reflux disease) 04/20/2019   Essential hypertension 04/20/2019   Bilateral hip pain 05/09/2018   Body mass index 40.0-44.9, adult (HCC) 05/09/2018   Class 3 severe obesity with serious comorbidity and body mass index (BMI) of 45.0 to 49.9 in adult [facility]) 05/09/2018   Chronic pain of both knees 08/17/2017   Past Medical History:  Diagnosis Date   ADD (attention  deficit disorder)    Arthritis of both knees    Cancer (HCC)    Colon Cancer adn polyps   Edema of both lower extremities    Essential hypertension 04/20/2019   GERD (gastroesophageal reflux disease) 04/20/2019   History of hysterectomy    patrial hysterectomy   Hyperlipidemia    Hypothyroidism (acquired)    Multiple thyroid  nodules   Joint pain    OSA (obstructive sleep apnea) 11/11/2021   Pure hypercholesterolemia 08/11/2021   Snoring 08/11/2021   Thyroid  disease    Hypothyroidism   Vertigo     Past Surgical History:  Procedure Laterality Date   ABDOMINAL HYSTERECTOMY     CESAREAN SECTION      Current Outpatient Medications  Medication Sig Dispense Refill Last Dose/Taking   acetaminophen  (TYLENOL ) 500 MG tablet 1 po q 4-6 hrs prn 60 tablet 0    aspirin EC 81 MG tablet Take 81 mg by mouth daily. Swallow whole.      CALCIUM  PO Take by mouth. (Patient not taking: Reported on 02/01/2024)      Cholecalciferol (VITAMIN D -3) 125 MCG (5000 UT) TABS Take by mouth.      cyanocobalamin (VITAMIN B12) 1000 MCG tablet Take 1,000 mcg by mouth daily.      hydrochlorothiazide  (HYDRODIURIL ) 25 MG tablet Take 1 tablet (25 mg total) by mouth daily. 87 tablet 1    ibuprofen (ADVIL) 200 MG tablet Take 600 mg by mouth every 6 (six) hours as needed for mild pain (pain score 1-3).      levothyroxine  (SYNTHROID ) 88 MCG tablet Take 1 tablet (88 mcg total) by mouth daily. Take on an empty  stomach 90 tablet 4    Magnesium Glycinate 100 MG CAPS Take 200 mg by mouth daily.      meclizine  (ANTIVERT ) 25 MG tablet Take 1 tablet (25 mg total) by mouth every 12 (twelve) hours as needed for vertigo symptoms. 30 tablet 0    Multiple Vitamins-Minerals (CENTRUM SILVER WOMEN 50+) TABS Take 1 tablet by mouth daily.      Propylene Glycol (SYSTANE COMPLETE OP) Apply 1 drop to eye daily.      rosuvastatin  (CRESTOR ) 20 MG tablet Take 1 tablet (20 mg total) by mouth daily. 30 tablet 0    Spacer/Aero-Holding Chambers  (COMPACT SPACE CHAMBER/LG MASK) DEVI Use with albuterol  inhaler as directed 1 each 0    TURMERIC PO Take 15 mLs by mouth daily. Suspension: Tumeric, Ginger, Black pepper, Lemon, Orange, Olive oil      valACYclovir  (VALTREX ) 1000 MG tablet Take 1,000 mg by mouth 2 (two) times daily as needed (cold sore). Seven day therapy      Current Facility-Administered Medications  Medication Dose Route Frequency Provider Last Rate Last Admin   methylPREDNISolone  acetate (DEPO-MEDROL ) injection 40 mg  40 mg Intra-articular Once Hilts, Michael, MD       No Known Allergies  Social History   Tobacco Use   Smoking status: Never   Smokeless tobacco: Never  Substance Use Topics   Alcohol use: No    Family History  Problem Relation Age of Onset   Thyroid  disease Mother    Hearing loss Mother    Glaucoma Mother    Hyperlipidemia Mother    High Cholesterol Mother    Sleep apnea Mother    Colon cancer Maternal Grandmother      Review of Systems  All other systems reviewed and are negative.   Objective:  Physical Exam Constitutional:      General: She is not in acute distress.    Appearance: Normal appearance. She is not ill-appearing.  HENT:     Head: Normocephalic and atraumatic.     Right Ear: External ear normal.     Left Ear: External ear normal.     Nose: Nose normal.     Mouth/Throat:     Mouth: Mucous membranes are moist.     Pharynx: Oropharynx is clear.  Eyes:     Extraocular Movements: Extraocular movements intact.     Conjunctiva/sclera: Conjunctivae normal.  Cardiovascular:     Rate and Rhythm: Normal rate.     Pulses: Normal pulses.  Pulmonary:     Effort: Pulmonary effort is normal.  Abdominal:     General: Bowel sounds are normal.     Palpations: Abdomen is soft.  Musculoskeletal:        General: Tenderness present.     Cervical back: Normal range of motion and neck supple.     Comments: TTP over groin, lateral aspect, greater trochanter.  Mild IT band tenderness.   No significant swelling.  No overlying lesions of area of chief complaint.  Decreased strength and ROM due to elicited pain.  Dorsiflexion and plantarflexion intact.  BLE appear grossly neurovascularly intact.  Gait mildly antalgic.   Skin:    General: Skin is warm and dry.  Neurological:     Mental Status: She is alert and oriented to person, place, and time. Mental status is at baseline.  Psychiatric:        Mood and Affect: Mood normal.        Behavior: Behavior normal.     Vital  signs in last 24 hours: @VSRANGES @  Labs:   Estimated body mass index is 39.36 kg/m as calculated from the following:   Height as of 01/10/24: 5' 3 (1.6 m).   Weight as of 01/10/24: 100.8 kg.   Imaging Review Plain radiographs demonstrate severe degenerative joint disease of the right hip(s). The bone quality appears to be fair for age and reported activity level.      Assessment/Plan:  End stage arthritis, right hip(s)  The patient history, physical examination, clinical judgement of the provider and imaging studies are consistent with end stage degenerative joint disease of the right hip(s) and total hip arthroplasty is deemed medically necessary. The treatment options including medical management, injection therapy, arthroscopy and arthroplasty were discussed at length. The risks and benefits of total hip arthroplasty were presented and reviewed. The risks due to aseptic loosening, infection, stiffness, dislocation/subluxation,  thromboembolic complications and other imponderables were discussed.  The patient acknowledged the explanation, agreed to proceed with the plan and consent was signed. Patient is being admitted for inpatient treatment for surgery, pain control, PT, OT, prophylactic antibiotics, VTE prophylaxis, progressive ambulation and ADL's and discharge planning.The patient is planning to be discharged home with OPPT   Anticipated LOS equal to or greater than 2 midnights due to - Age  36 and older with one or more of the following:  - Obesity  - Expected need for hospital services (PT, OT, Nursing) required for safe  discharge  - Anticipated need for postoperative skilled nursing care or inpatient rehab  - Active co-morbidities: prediabetes, HTN, HLD, OSA, obesity, vertigo, hx of iron def anemia, HSV, GERD, CAD OR   - Unanticipated findings during/Post Surgery: None  - Patient is a high risk of re-admission due to: None

## 2024-02-05 NOTE — Progress Notes (Signed)
 PCR: + STAPH.

## 2024-02-05 NOTE — H&P (View-Only) (Signed)
 TOTAL HIP ADMISSION H&P  Patient is admitted for right total hip arthroplasty.  Subjective:  Chief Complaint: right hip pain  HPI: Samantha [name], 63 y.o. female, has a history of pain and functional disability in the right hip(s) due to arthritis and patient has failed non-surgical conservative treatments for greater than 12 weeks to include NSAID's and/or analgesics, corticosteriod injections, use of assistive devices, and activity modification.  Onset of symptoms was gradual starting 2 years ago with gradually worsening course since that time.The patient noted no past surgery on the right hip(s).  Patient currently rates pain in the right hip at 10 out of 10 with activity. Patient has night pain, worsening of pain with activity and weight bearing, pain that interfers with activities of daily living, and pain with passive range of motion. Patient has evidence of periarticular osteophytes and joint space narrowing by imaging studies. This condition presents safety issues increasing the risk of falls.  There is no current active infection.  Patient Active Problem List   Diagnosis Date Noted   Primary osteoarthritis of right hip 04/17/2023   Difficulty sleeping 05/18/2022   Prediabetes 04/13/2022   Other fatigue 01/17/2022   SOB (shortness of breath) 01/17/2022   Depression screening 01/17/2022   OSA (obstructive sleep apnea) 11/11/2021   Pure hypercholesterolemia 08/11/2021   Snoring 08/11/2021   Bilateral primary osteoarthritis of knee 06/16/2021   Body mass index 45.0-49.9, adult (HCC) 06/16/2021   GERD (gastroesophageal reflux disease) 04/20/2019   Essential hypertension 04/20/2019   Bilateral hip pain 05/09/2018   Body mass index 40.0-44.9, adult (HCC) 05/09/2018   Class 3 severe obesity with serious comorbidity and body mass index (BMI) of 45.0 to 49.9 in adult [facility]) 05/09/2018   Chronic pain of both knees 08/17/2017   Past Medical History:  Diagnosis Date   ADD (attention  deficit disorder)    Arthritis of both knees    Cancer (HCC)    Colon Cancer adn polyps   Edema of both lower extremities    Essential hypertension 04/20/2019   GERD (gastroesophageal reflux disease) 04/20/2019   History of hysterectomy    patrial hysterectomy   Hyperlipidemia    Hypothyroidism (acquired)    Multiple thyroid  nodules   Joint pain    OSA (obstructive sleep apnea) 11/11/2021   Pure hypercholesterolemia 08/11/2021   Snoring 08/11/2021   Thyroid  disease    Hypothyroidism   Vertigo     Past Surgical History:  Procedure Laterality Date   ABDOMINAL HYSTERECTOMY     CESAREAN SECTION      Current Outpatient Medications  Medication Sig Dispense Refill Last Dose/Taking   acetaminophen  (TYLENOL ) 500 MG tablet 1 po q 4-6 hrs prn 60 tablet 0    aspirin EC 81 MG tablet Take 81 mg by mouth daily. Swallow whole.      CALCIUM  PO Take by mouth. (Patient not taking: Reported on 02/01/2024)      Cholecalciferol (VITAMIN D -3) 125 MCG (5000 UT) TABS Take by mouth.      cyanocobalamin (VITAMIN B12) 1000 MCG tablet Take 1,000 mcg by mouth daily.      hydrochlorothiazide  (HYDRODIURIL ) 25 MG tablet Take 1 tablet (25 mg total) by mouth daily. 87 tablet 1    ibuprofen (ADVIL) 200 MG tablet Take 600 mg by mouth every 6 (six) hours as needed for mild pain (pain score 1-3).      levothyroxine  (SYNTHROID ) 88 MCG tablet Take 1 tablet (88 mcg total) by mouth daily. Take on an empty  stomach 90 tablet 4    Magnesium Glycinate 100 MG CAPS Take 200 mg by mouth daily.      meclizine  (ANTIVERT ) 25 MG tablet Take 1 tablet (25 mg total) by mouth every 12 (twelve) hours as needed for vertigo symptoms. 30 tablet 0    Multiple Vitamins-Minerals (CENTRUM SILVER WOMEN 50+) TABS Take 1 tablet by mouth daily.      Propylene Glycol (SYSTANE COMPLETE OP) Apply 1 drop to eye daily.      rosuvastatin  (CRESTOR ) 20 MG tablet Take 1 tablet (20 mg total) by mouth daily. 30 tablet 0    Spacer/Aero-Holding Chambers  (COMPACT SPACE CHAMBER/LG MASK) DEVI Use with albuterol  inhaler as directed 1 each 0    TURMERIC PO Take 15 mLs by mouth daily. Suspension: Tumeric, Ginger, Black pepper, Lemon, Orange, Olive oil      valACYclovir  (VALTREX ) 1000 MG tablet Take 1,000 mg by mouth 2 (two) times daily as needed (cold sore). Seven day therapy      Current Facility-Administered Medications  Medication Dose Route Frequency Provider Last Rate Last Admin   methylPREDNISolone  acetate (DEPO-MEDROL ) injection 40 mg  40 mg Intra-articular Once Hilts, Michael, MD       No Known Allergies  Social History   Tobacco Use   Smoking status: Never   Smokeless tobacco: Never  Substance Use Topics   Alcohol use: No    Family History  Problem Relation Age of Onset   Thyroid  disease Mother    Hearing loss Mother    Glaucoma Mother    Hyperlipidemia Mother    High Cholesterol Mother    Sleep apnea Mother    Colon cancer Maternal Grandmother      Review of Systems  All other systems reviewed and are negative.   Objective:  Physical Exam Constitutional:      General: She is not in acute distress.    Appearance: Normal appearance. She is not ill-appearing.  HENT:     Head: Normocephalic and atraumatic.     Right Ear: External ear normal.     Left Ear: External ear normal.     Nose: Nose normal.     Mouth/Throat:     Mouth: Mucous membranes are moist.     Pharynx: Oropharynx is clear.  Eyes:     Extraocular Movements: Extraocular movements intact.     Conjunctiva/sclera: Conjunctivae normal.  Cardiovascular:     Rate and Rhythm: Normal rate.     Pulses: Normal pulses.  Pulmonary:     Effort: Pulmonary effort is normal.  Abdominal:     General: Bowel sounds are normal.     Palpations: Abdomen is soft.  Musculoskeletal:        General: Tenderness present.     Cervical back: Normal range of motion and neck supple.     Comments: TTP over groin, lateral aspect, greater trochanter.  Mild IT band tenderness.   No significant swelling.  No overlying lesions of area of chief complaint.  Decreased strength and ROM due to elicited pain.  Dorsiflexion and plantarflexion intact.  BLE appear grossly neurovascularly intact.  Gait mildly antalgic.   Skin:    General: Skin is warm and dry.  Neurological:     Mental Status: She is alert and oriented to person, place, and time. Mental status is at baseline.  Psychiatric:        Mood and Affect: Mood normal.        Behavior: Behavior normal.     Vital  signs in last 24 hours: @VSRANGES @  Labs:   Estimated body mass index is 39.36 kg/m as calculated from the following:   Height as of 01/10/24: 5' 3 (1.6 m).   Weight as of 01/10/24: 100.8 kg.   Imaging Review Plain radiographs demonstrate severe degenerative joint disease of the right hip(s). The bone quality appears to be fair for age and reported activity level.      Assessment/Plan:  End stage arthritis, right hip(s)  The patient history, physical examination, clinical judgement of the provider and imaging studies are consistent with end stage degenerative joint disease of the right hip(s) and total hip arthroplasty is deemed medically necessary. The treatment options including medical management, injection therapy, arthroscopy and arthroplasty were discussed at length. The risks and benefits of total hip arthroplasty were presented and reviewed. The risks due to aseptic loosening, infection, stiffness, dislocation/subluxation,  thromboembolic complications and other imponderables were discussed.  The patient acknowledged the explanation, agreed to proceed with the plan and consent was signed. Patient is being admitted for inpatient treatment for surgery, pain control, PT, OT, prophylactic antibiotics, VTE prophylaxis, progressive ambulation and ADL's and discharge planning.The patient is planning to be discharged home with OPPT   Anticipated LOS equal to or greater than 2 midnights due to - Age  36 and older with one or more of the following:  - Obesity  - Expected need for hospital services (PT, OT, Nursing) required for safe  discharge  - Anticipated need for postoperative skilled nursing care or inpatient rehab  - Active co-morbidities: prediabetes, HTN, HLD, OSA, obesity, vertigo, hx of iron def anemia, HSV, GERD, CAD OR   - Unanticipated findings during/Post Surgery: None  - Patient is a high risk of re-admission due to: None

## 2024-02-06 ENCOUNTER — Encounter (HOSPITAL_COMMUNITY): Payer: Self-pay

## 2024-02-06 NOTE — Progress Notes (Signed)
 Case: [id number] Date/Time: 02/18/24 1238   Procedure: ARTHROPLASTY, HIP, TOTAL,POSTERIOR APPROACH (Right: Hip)   Anesthesia type: Spinal   Pre-op diagnosis: OA RIGHT HIP   Location: [facility] ROOM 08 / [facility]   Surgeons: Samantha Toribio LABOR, MD       DISCUSSION: Samantha [name] is a 63 yo female with PMH of HTN, CAD, OSA (intolerant of CPAP), GERD, hypothyroid, arthritis.  Seen by Cardiology on 01/10/24 for pre op clearance. She denied any cardiac symptoms. She reported not using CPAP due to intolerance. BP controlled. CAD medically managed. No further w/u recommended. Pt cleared for surgery:  Preop clearance   Samantha [name]'s perioperative risk of a major cardiac event is 0.9% according to the Revised Cardiac Risk Index (RCRI).  Therefore, she is at low risk for perioperative complications.   Her functional capacity is good at 6.55 METs according to the [place] Activity Status Index (DASI). Recommendations: According to ACC/AHA guidelines, no further cardiovascular testing needed.  The patient may proceed to surgery at acceptable risk.   Antiplatelet and/or Anticoagulation Recommendations: Aspirin can be held for 5-7 days prior to her surgery.  Please resume Aspirin post operatively when it is felt to be safe from a bleeding standpoint.    VS: BP (!) 140/77   Pulse 65   Temp 37.1 C (Oral)   Ht 5' 3 (1.6 m)   Wt 99.8 kg   SpO2 100%   BMI 38.97 kg/m   PROVIDERS: Samantha Anthony RAMAN, FNP Cardiologist - Samantha Riggs, MD   LABS: Labs reviewed: Acceptable for surgery. (all labs ordered are listed, but only abnormal results are displayed)  Labs Reviewed  SURGICAL PCR SCREEN - Abnormal; Notable for the following components:      Result Value   Staphylococcus aureus POSITIVE (*)    All other components within normal limits  CBC WITH DIFFERENTIAL/PLATELET  COMPREHENSIVE METABOLIC PANEL WITH GFR  TYPE AND SCREEN    EKG 01/12/24 (scanned in media on 11/19)   NSR  Stress test  03/16/2017:   Blood pressure demonstrated a hypertensive response to exercise. There was no ST segment deviation noted during stress.   Poor exercise capacity. Hypertensive response to exertion (210/75 mmHg). No ischemia.   Echo 06/26/2016:  Study Conclusions  - Left ventricle: The cavity size was normal. Systolic function was   normal. The estimated ejection fraction was in the range of 55%   to 60%. Wall motion was normal; there were no regional wall   motion abnormalities. Left ventricular diastolic function   parameters were normal.  Past Medical History:  Diagnosis Date   ADD (attention deficit disorder)    Anemia    Arthritis of both knees    Coronary artery disease    Edema of both lower extremities    Essential hypertension 04/20/2019   GERD (gastroesophageal reflux disease) 04/20/2019   History of hysterectomy    patrial hysterectomy   Hyperlipidemia    Hypothyroidism (acquired)    Multiple thyroid  nodules   Joint pain    OSA (obstructive sleep apnea) 11/11/2021   Pure hypercholesterolemia 08/11/2021   Snoring 08/11/2021   Thyroid  disease    Hypothyroidism   Vertigo     Past Surgical History:  Procedure Laterality Date   ABDOMINAL HYSTERECTOMY     CATARACT EXTRACTION, BILATERAL     CESAREAN SECTION     COLONOSCOPY     TONSILLECTOMY      MEDICATIONS:  acetaminophen  (TYLENOL ) 500 MG tablet   aspirin EC 81 MG  tablet   CALCIUM  PO   Cholecalciferol (VITAMIN D -3) 125 MCG (5000 UT) TABS   cyanocobalamin (VITAMIN B12) 1000 MCG tablet   hydrochlorothiazide  (HYDRODIURIL ) 25 MG tablet   ibuprofen (ADVIL) 200 MG tablet   levothyroxine  (SYNTHROID ) 88 MCG tablet   Magnesium Glycinate 100 MG CAPS   meclizine  (ANTIVERT ) 25 MG tablet   Multiple Vitamins-Minerals (CENTRUM SILVER WOMEN 50+) TABS   Propylene Glycol (SYSTANE COMPLETE OP)   rosuvastatin  (CRESTOR ) 20 MG tablet   Spacer/Aero-Holding Chambers (COMPACT SPACE CHAMBER/LG MASK) DEVI   TURMERIC PO    valACYclovir  (VALTREX ) 1000 MG tablet    methylPREDNISolone  acetate (DEPO-MEDROL ) injection 40 mg   Samantha [name] M Zechariah Bissonnette, PA-C [facility] Short Stay/Anesthesiology [facility] Phone [phone] 02/06/2024 1:42 PM

## 2024-02-06 NOTE — Anesthesia Preprocedure Evaluation (Addendum)
 Anesthesia Evaluation  Patient identified by MRN, date of birth, ID band Patient awake    Reviewed: Allergy & Precautions, NPO status , Patient's Chart, lab work & pertinent test results, reviewed documented beta blocker date and time   History of Anesthesia Complications Negative for: history of anesthetic complications  Airway Mallampati: II  TM Distance: >3 FB     Dental no notable dental hx.    Pulmonary sleep apnea , neg COPD   breath sounds clear to auscultation       Cardiovascular hypertension, + CAD   Rhythm:Regular Rate:Normal  Left ventricle: The cavity size was normal. Systolic function was    normal. The estimated ejection fraction was in the range of 55%    to 60%. Wall motion was normal; there were no regional wall    motion abnormalities. Left ventricular diastolic function    parameters were normal.     Neuro/Psych neg Seizures PSYCHIATRIC DISORDERS         GI/Hepatic ,GERD  ,,  Endo/Other  Hypothyroidism    Renal/GU      Musculoskeletal  (+) Arthritis ,    Abdominal   Peds  Hematology  (+) Blood dyscrasia, anemia   Anesthesia Other Findings   Reproductive/Obstetrics                              Anesthesia Physical Anesthesia Plan  ASA: 2  Anesthesia Plan: Spinal   Post-op Pain Management:    Induction: Intravenous  PONV Risk Score and Plan: Ondansetron  and Propofol infusion  Airway Management Planned: Natural Airway and Simple Face Mask  Additional Equipment:   Intra-op Plan:   Post-operative Plan:   Informed Consent: I have reviewed the patients History and Physical, chart, labs and discussed the procedure including the risks, benefits and alternatives for the proposed anesthesia with the patient or authorized representative who has indicated his/her understanding and acceptance.     Dental advisory given  Plan Discussed with: CRNA  Anesthesia  Plan Comments: (See PAT note from 11/18)         Anesthesia Quick Evaluation

## 2024-02-18 ENCOUNTER — Other Ambulatory Visit (HOSPITAL_BASED_OUTPATIENT_CLINIC_OR_DEPARTMENT_OTHER): Payer: Self-pay

## 2024-02-18 ENCOUNTER — Observation Stay (HOSPITAL_COMMUNITY)
Admission: RE | Admit: 2024-02-18 | Discharge: 2024-02-19 | Disposition: A | Attending: Orthopedic Surgery | Admitting: Orthopedic Surgery

## 2024-02-18 ENCOUNTER — Encounter (HOSPITAL_COMMUNITY): Admission: RE | Disposition: A | Payer: Self-pay | Source: Home / Self Care | Attending: Orthopedic Surgery

## 2024-02-18 ENCOUNTER — Observation Stay (HOSPITAL_COMMUNITY)

## 2024-02-18 ENCOUNTER — Other Ambulatory Visit: Payer: Self-pay

## 2024-02-18 ENCOUNTER — Ambulatory Visit (HOSPITAL_COMMUNITY): Admitting: Certified Registered Nurse Anesthetist

## 2024-02-18 ENCOUNTER — Ambulatory Visit (HOSPITAL_COMMUNITY): Payer: Self-pay | Admitting: Physician Assistant

## 2024-02-18 ENCOUNTER — Encounter (HOSPITAL_COMMUNITY): Payer: Self-pay | Admitting: Orthopedic Surgery

## 2024-02-18 DIAGNOSIS — E039 Hypothyroidism, unspecified: Secondary | ICD-10-CM | POA: Insufficient documentation

## 2024-02-18 DIAGNOSIS — Z79899 Other long term (current) drug therapy: Secondary | ICD-10-CM | POA: Insufficient documentation

## 2024-02-18 DIAGNOSIS — Z01818 Encounter for other preprocedural examination: Secondary | ICD-10-CM

## 2024-02-18 DIAGNOSIS — M1611 Unilateral primary osteoarthritis, right hip: Principal | ICD-10-CM | POA: Diagnosis present

## 2024-02-18 DIAGNOSIS — I2510 Atherosclerotic heart disease of native coronary artery without angina pectoris: Secondary | ICD-10-CM | POA: Insufficient documentation

## 2024-02-18 DIAGNOSIS — I10 Essential (primary) hypertension: Secondary | ICD-10-CM | POA: Diagnosis not present

## 2024-02-18 DIAGNOSIS — Z7982 Long term (current) use of aspirin: Secondary | ICD-10-CM | POA: Diagnosis not present

## 2024-02-18 DIAGNOSIS — G8929 Other chronic pain: Secondary | ICD-10-CM

## 2024-02-18 HISTORY — PX: TOTAL HIP ARTHROPLASTY: SHX124

## 2024-02-18 LAB — ABO/RH: ABO/RH(D): A POS

## 2024-02-18 SURGERY — ARTHROPLASTY, HIP, TOTAL,POSTERIOR APPROACH
Anesthesia: Spinal | Site: Hip | Laterality: Right

## 2024-02-18 MED ORDER — SODIUM CHLORIDE (PF) 0.9 % IJ SOLN
INTRAMUSCULAR | Status: AC
  Filled 2024-02-18: qty 50

## 2024-02-18 MED ORDER — ACETAMINOPHEN 10 MG/ML IV SOLN: 1000.0000 mg | Freq: Once | INTRAVENOUS | Status: DC | PRN

## 2024-02-18 MED ORDER — PHENYLEPHRINE 80 MCG/ML (10ML) SYRINGE FOR IV PUSH (FOR BLOOD PRESSURE SUPPORT)
PREFILLED_SYRINGE | INTRAVENOUS | Status: AC
  Filled 2024-02-18: qty 10

## 2024-02-18 MED ORDER — BUPIVACAINE-EPINEPHRINE (PF) 0.25% -1:200000 IJ SOLN
INTRAMUSCULAR | Status: AC
  Filled 2024-02-18: qty 30

## 2024-02-18 MED ORDER — ISOPROPYL ALCOHOL 70 % SOLN
Status: DC | PRN
  Administered 2024-02-18: 1 via TOPICAL

## 2024-02-18 MED ORDER — ACETAMINOPHEN 500 MG PO TABS
1000.0000 mg | ORAL_TABLET | Freq: Four times a day (QID) | ORAL | Status: DC
  Administered 2024-02-18 – 2024-02-19 (×3): 1000 mg via ORAL
  Filled 2024-02-18 (×3): qty 2

## 2024-02-18 MED ORDER — ROSUVASTATIN CALCIUM 20 MG PO TABS
20.0000 mg | ORAL_TABLET | Freq: Every day | ORAL | Status: DC
  Administered 2024-02-19: 20 mg via ORAL
  Filled 2024-02-18: qty 1

## 2024-02-18 MED ORDER — LACTATED RINGERS IV SOLN: INTRAVENOUS | Status: DC

## 2024-02-18 MED ORDER — MUPIROCIN 2 % EX OINT
1.0000 | TOPICAL_OINTMENT | Freq: Two times a day (BID) | CUTANEOUS | 0 refills | Status: AC
  Filled 2024-02-18: qty 22, 30d supply, fill #0

## 2024-02-18 MED ORDER — ONDANSETRON HCL 4 MG/2ML IJ SOLN: 4.0000 mg | Freq: Four times a day (QID) | INTRAMUSCULAR | Status: DC | PRN

## 2024-02-18 MED ORDER — WATER FOR IRRIGATION, STERILE IR SOLN
Status: DC | PRN
  Administered 2024-02-18: 2000 mL

## 2024-02-18 MED ORDER — HYDROCHLOROTHIAZIDE 25 MG PO TABS
25.0000 mg | ORAL_TABLET | Freq: Every day | ORAL | Status: DC
  Administered 2024-02-19: 25 mg via ORAL
  Filled 2024-02-18: qty 1

## 2024-02-18 MED ORDER — FENTANYL CITRATE (PF) 50 MCG/ML IJ SOSY
PREFILLED_SYRINGE | INTRAMUSCULAR | Status: AC
  Filled 2024-02-18: qty 2

## 2024-02-18 MED ORDER — MEPIVACAINE HCL (PF) 2 % IJ SOLN
INTRAMUSCULAR | Status: DC | PRN
  Administered 2024-02-18: 3 mL via EPIDURAL

## 2024-02-18 MED ORDER — ORAL CARE MOUTH RINSE: 15.0000 mL | Freq: Once | OROMUCOSAL | Status: AC

## 2024-02-18 MED ORDER — GLYCOPYRROLATE 0.2 MG/ML IJ SOLN
INTRAMUSCULAR | Status: DC | PRN
  Administered 2024-02-18: .2 mg via INTRAVENOUS

## 2024-02-18 MED ORDER — ACETAMINOPHEN 325 MG PO TABS: 325.0000 mg | ORAL_TABLET | Freq: Four times a day (QID) | ORAL | Status: DC | PRN

## 2024-02-18 MED ORDER — CHLORHEXIDINE GLUCONATE 0.12 % MT SOLN
15.0000 mL | Freq: Once | OROMUCOSAL | Status: AC
  Administered 2024-02-18: 15 mL via OROMUCOSAL

## 2024-02-18 MED ORDER — PANTOPRAZOLE SODIUM 40 MG PO TBEC
40.0000 mg | DELAYED_RELEASE_TABLET | Freq: Every day | ORAL | Status: DC
  Administered 2024-02-19: 40 mg via ORAL
  Filled 2024-02-18: qty 1

## 2024-02-18 MED ORDER — TRANEXAMIC ACID-NACL 1000-0.7 MG/100ML-% IV SOLN
1000.0000 mg | INTRAVENOUS | Status: AC
  Administered 2024-02-18: 1000 mg via INTRAVENOUS
  Filled 2024-02-18: qty 100

## 2024-02-18 MED ORDER — 0.9 % SODIUM CHLORIDE (POUR BTL) OPTIME
TOPICAL | Status: DC | PRN
  Administered 2024-02-18: 1000 mL

## 2024-02-18 MED ORDER — POLYETHYLENE GLYCOL 3350 17 GM/SCOOP PO POWD
17.0000 g | Freq: Every day | ORAL | 0 refills | Status: AC
  Filled 2024-02-18: qty 238, 14d supply, fill #0

## 2024-02-18 MED ORDER — POLYETHYLENE GLYCOL 3350 17 G PO PACK: 17.0000 g | PACK | Freq: Every day | ORAL | Status: DC | PRN

## 2024-02-18 MED ORDER — OXYCODONE HCL 5 MG PO TABS: 5.0000 mg | ORAL_TABLET | Freq: Once | ORAL | Status: DC | PRN

## 2024-02-18 MED ORDER — CEFAZOLIN SODIUM-DEXTROSE 2-4 GM/100ML-% IV SOLN
2.0000 g | Freq: Four times a day (QID) | INTRAVENOUS | Status: AC
  Administered 2024-02-18 (×2): 2 g via INTRAVENOUS
  Filled 2024-02-18 (×2): qty 100

## 2024-02-18 MED ORDER — KETOROLAC TROMETHAMINE 15 MG/ML IJ SOLN
7.5000 mg | Freq: Four times a day (QID) | INTRAMUSCULAR | Status: DC
  Administered 2024-02-18 – 2024-02-19 (×3): 7.5 mg via INTRAVENOUS
  Filled 2024-02-18 (×3): qty 1

## 2024-02-18 MED ORDER — PROPOFOL 1000 MG/100ML IV EMUL
INTRAVENOUS | Status: AC
  Filled 2024-02-18: qty 100

## 2024-02-18 MED ORDER — SENNA 8.6 MG PO TABS
1.0000 | ORAL_TABLET | Freq: Two times a day (BID) | ORAL | Status: DC
  Administered 2024-02-18 – 2024-02-19 (×2): 8.6 mg via ORAL
  Filled 2024-02-18 (×2): qty 1

## 2024-02-18 MED ORDER — FENTANYL CITRATE (PF) 50 MCG/ML IJ SOSY
25.0000 ug | PREFILLED_SYRINGE | INTRAMUSCULAR | Status: DC | PRN
  Administered 2024-02-18 (×2): 50 ug via INTRAVENOUS

## 2024-02-18 MED ORDER — ASPIRIN 81 MG PO TBEC: 81.0000 mg | DELAYED_RELEASE_TABLET | Freq: Two times a day (BID) | ORAL | Status: AC

## 2024-02-18 MED ORDER — ISOPROPYL ALCOHOL 70 % SOLN
Status: AC
  Filled 2024-02-18: qty 480

## 2024-02-18 MED ORDER — MIDAZOLAM HCL 2 MG/2ML IJ SOLN
INTRAMUSCULAR | Status: AC
  Filled 2024-02-18: qty 2

## 2024-02-18 MED ORDER — VALACYCLOVIR HCL 500 MG PO TABS: 1000.0000 mg | ORAL_TABLET | Freq: Two times a day (BID) | ORAL | Status: DC | PRN

## 2024-02-18 MED ORDER — SODIUM CHLORIDE 0.9 % IV SOLN: INTRAVENOUS | Status: DC

## 2024-02-18 MED ORDER — CELECOXIB 100 MG PO CAPS
100.0000 mg | ORAL_CAPSULE | Freq: Two times a day (BID) | ORAL | 0 refills | Status: AC
  Filled 2024-02-18: qty 28, 14d supply, fill #0

## 2024-02-18 MED ORDER — ONDANSETRON HCL 4 MG PO TABS
4.0000 mg | ORAL_TABLET | Freq: Three times a day (TID) | ORAL | 0 refills | Status: AC | PRN
  Filled 2024-02-18: qty 14, 5d supply, fill #0

## 2024-02-18 MED ORDER — MECLIZINE HCL 25 MG PO TABS: 25.0000 mg | ORAL_TABLET | Freq: Two times a day (BID) | ORAL | Status: DC | PRN

## 2024-02-18 MED ORDER — CHLORHEXIDINE GLUCONATE 4 % EX SOLN
1.0000 | CUTANEOUS | 1 refills | Status: AC
  Filled 2024-02-18: qty 946, 30d supply, fill #0

## 2024-02-18 MED ORDER — ONDANSETRON HCL 4 MG/2ML IJ SOLN: 4.0000 mg | Freq: Once | INTRAMUSCULAR | Status: DC | PRN

## 2024-02-18 MED ORDER — ONDANSETRON HCL 4 MG/2ML IJ SOLN
INTRAMUSCULAR | Status: AC
  Filled 2024-02-18: qty 2

## 2024-02-18 MED ORDER — ONDANSETRON HCL 4 MG/2ML IJ SOLN
INTRAMUSCULAR | Status: DC | PRN
  Administered 2024-02-18: 4 mg via INTRAVENOUS

## 2024-02-18 MED ORDER — SODIUM CHLORIDE 0.9 % IR SOLN
Status: DC | PRN
  Administered 2024-02-18: 3000 mL

## 2024-02-18 MED ORDER — CEFAZOLIN SODIUM-DEXTROSE 2-4 GM/100ML-% IV SOLN
2.0000 g | INTRAVENOUS | Status: AC
  Administered 2024-02-18: 2 g via INTRAVENOUS
  Filled 2024-02-18: qty 100

## 2024-02-18 MED ORDER — EPHEDRINE SULFATE (PRESSORS) 25 MG/5ML IV SOSY
PREFILLED_SYRINGE | INTRAVENOUS | Status: DC | PRN
  Administered 2024-02-18: 15 mg via INTRAVENOUS

## 2024-02-18 MED ORDER — OXYCODONE HCL 5 MG PO TABS
5.0000 mg | ORAL_TABLET | ORAL | Status: DC | PRN
  Administered 2024-02-18 – 2024-02-19 (×2): 10 mg via ORAL
  Filled 2024-02-18 (×3): qty 2

## 2024-02-18 MED ORDER — LEVOTHYROXINE SODIUM 88 MCG PO TABS
88.0000 ug | ORAL_TABLET | Freq: Every day | ORAL | Status: DC
  Administered 2024-02-19: 88 ug via ORAL
  Filled 2024-02-18: qty 1

## 2024-02-18 MED ORDER — SODIUM CHLORIDE (PF) 0.9 % IJ SOLN
INTRAMUSCULAR | Status: DC | PRN
  Administered 2024-02-18: 80 mL

## 2024-02-18 MED ORDER — DEXAMETHASONE SOD PHOSPHATE PF 10 MG/ML IJ SOLN
8.0000 mg | Freq: Once | INTRAMUSCULAR | Status: AC
  Administered 2024-02-18: 8 mg via INTRAVENOUS

## 2024-02-18 MED ORDER — DOCUSATE SODIUM 100 MG PO CAPS
100.0000 mg | ORAL_CAPSULE | Freq: Two times a day (BID) | ORAL | Status: DC
  Administered 2024-02-18 – 2024-02-19 (×2): 100 mg via ORAL
  Filled 2024-02-18 (×2): qty 1

## 2024-02-18 MED ORDER — ZOLPIDEM TARTRATE 5 MG PO TABS: 5.0000 mg | ORAL_TABLET | Freq: Every evening | ORAL | Status: DC | PRN

## 2024-02-18 MED ORDER — EPHEDRINE 5 MG/ML INJ
INTRAVENOUS | Status: AC
  Filled 2024-02-18: qty 5

## 2024-02-18 MED ORDER — DIPHENHYDRAMINE HCL 12.5 MG/5ML PO ELIX: 12.5000 mg | ORAL_SOLUTION | ORAL | Status: DC | PRN

## 2024-02-18 MED ORDER — POVIDONE-IODINE 10 % EX SWAB: 2.0000 | Freq: Once | CUTANEOUS | Status: DC

## 2024-02-18 MED ORDER — ACETAMINOPHEN 500 MG PO TABS
1000.0000 mg | ORAL_TABLET | Freq: Once | ORAL | Status: AC
  Administered 2024-02-18: 1000 mg via ORAL
  Filled 2024-02-18: qty 2

## 2024-02-18 MED ORDER — METHOCARBAMOL 1000 MG/10ML IJ SOLN: 500.0000 mg | Freq: Four times a day (QID) | INTRAMUSCULAR | Status: DC | PRN

## 2024-02-18 MED ORDER — BUPIVACAINE LIPOSOME 1.3 % IJ SUSP
INTRAMUSCULAR | Status: AC
  Filled 2024-02-18: qty 20

## 2024-02-18 MED ORDER — MIDAZOLAM HCL 5 MG/5ML IJ SOLN
INTRAMUSCULAR | Status: DC | PRN
  Administered 2024-02-18: 2 mg via INTRAVENOUS

## 2024-02-18 MED ORDER — ONDANSETRON HCL 4 MG PO TABS: 4.0000 mg | ORAL_TABLET | Freq: Four times a day (QID) | ORAL | Status: DC | PRN

## 2024-02-18 MED ORDER — OMEPRAZOLE 40 MG PO CPDR
40.0000 mg | DELAYED_RELEASE_CAPSULE | Freq: Every day | ORAL | 0 refills | Status: AC
  Filled 2024-02-18: qty 21, 21d supply, fill #0

## 2024-02-18 MED ORDER — PROPOFOL 500 MG/50ML IV EMUL
INTRAVENOUS | Status: DC | PRN
  Administered 2024-02-18: 125 ug/kg/min via INTRAVENOUS

## 2024-02-18 MED ORDER — BUPIVACAINE LIPOSOME 1.3 % IJ SUSP: 10.0000 mL | Freq: Once | INTRAMUSCULAR | Status: DC

## 2024-02-18 MED ORDER — PHENOL 1.4 % MT LIQD: 1.0000 | OROMUCOSAL | Status: DC | PRN

## 2024-02-18 MED ORDER — PROPOFOL 10 MG/ML IV BOLUS
INTRAVENOUS | Status: DC | PRN
  Administered 2024-02-18 (×3): 20 mg via INTRAVENOUS

## 2024-02-18 MED ORDER — ACETAMINOPHEN 500 MG PO TABS: 1000.0000 mg | ORAL_TABLET | Freq: Three times a day (TID) | ORAL | Status: AC | PRN

## 2024-02-18 MED ORDER — PHENYLEPHRINE HCL-NACL 20-0.9 MG/250ML-% IV SOLN
INTRAVENOUS | Status: DC | PRN
  Administered 2024-02-18: 35 ug/min via INTRAVENOUS

## 2024-02-18 MED ORDER — ASPIRIN 81 MG PO CHEW
81.0000 mg | CHEWABLE_TABLET | Freq: Two times a day (BID) | ORAL | Status: DC
  Administered 2024-02-18 – 2024-02-19 (×2): 81 mg via ORAL
  Filled 2024-02-18 (×2): qty 1

## 2024-02-18 MED ORDER — OXYCODONE HCL 5 MG/5ML PO SOLN: 5.0000 mg | Freq: Once | ORAL | Status: DC | PRN

## 2024-02-18 MED ORDER — METHOCARBAMOL 500 MG PO TABS
500.0000 mg | ORAL_TABLET | Freq: Four times a day (QID) | ORAL | Status: DC | PRN
  Administered 2024-02-18 – 2024-02-19 (×2): 500 mg via ORAL
  Filled 2024-02-18 (×2): qty 1

## 2024-02-18 MED ORDER — HYDROMORPHONE HCL 1 MG/ML IJ SOLN: 0.5000 mg | INTRAMUSCULAR | Status: DC | PRN

## 2024-02-18 MED ORDER — MENTHOL 3 MG MT LOZG: 1.0000 | LOZENGE | OROMUCOSAL | Status: DC | PRN

## 2024-02-18 MED ORDER — METHOCARBAMOL 500 MG PO TABS
500.0000 mg | ORAL_TABLET | Freq: Three times a day (TID) | ORAL | 0 refills | Status: AC | PRN
  Filled 2024-02-18: qty 30, 10d supply, fill #0

## 2024-02-18 MED ORDER — OXYCODONE HCL 5 MG PO TABS
5.0000 mg | ORAL_TABLET | ORAL | 0 refills | Status: AC | PRN
  Filled 2024-02-18: qty 40, 7d supply, fill #0

## 2024-02-18 SURGICAL SUPPLY — 64 items
BAG COUNTER SPONGE SURGICOUNT (BAG) IMPLANT
BAG ZIPLOCK 12X15 (MISCELLANEOUS) ×2 IMPLANT
BIT DRILL TRIDENT 4X25 SU (BIT) IMPLANT
BLADE SAW SAG 25X90X1.19 (BLADE) ×2 IMPLANT
BRUSH FEMORAL CANAL (MISCELLANEOUS) IMPLANT
CHLORAPREP W/TINT 26 (MISCELLANEOUS) ×4 IMPLANT
CNTNR URN SCR LID CUP LEK RST (MISCELLANEOUS) ×2 IMPLANT
COVER SURGICAL LIGHT HANDLE (MISCELLANEOUS) ×2 IMPLANT
DERMABOND ADVANCED .7 DNX12 (GAUZE/BANDAGES/DRESSINGS) ×4 IMPLANT
DRAPE HIP W/POCKET STRL (MISCELLANEOUS) ×2 IMPLANT
DRAPE INCISE IOBAN 85X60 (DRAPES) ×2 IMPLANT
DRAPE POUCH INSTRU U-SHP 10X18 (DRAPES) ×2 IMPLANT
DRAPE SHEET LG 3/4 BI-LAMINATE (DRAPES) ×6 IMPLANT
DRAPE U-SHAPE 47X51 STRL (DRAPES) ×4 IMPLANT
DRESSING AQUACEL AG SP 3.5X10 (GAUZE/BANDAGES/DRESSINGS) IMPLANT
DRSG AQUACEL AG ADV 3.5X10 (GAUZE/BANDAGES/DRESSINGS) ×2 IMPLANT
ELECT BLADE TIP CTD 4 INCH (ELECTRODE) ×2 IMPLANT
ELECT REM PT RETURN 15FT ADLT (MISCELLANEOUS) ×2 IMPLANT
FACESHIELD WRAPAROUND OR TEAM (MASK) ×2 IMPLANT
GAUZE SPONGE 4X4 12PLY STRL (GAUZE/BANDAGES/DRESSINGS) ×2 IMPLANT
GLOVE BIO SURGEON STRL SZ 6.5 (GLOVE) ×4 IMPLANT
GLOVE BIOGEL PI IND STRL 6.5 (GLOVE) ×2 IMPLANT
GLOVE BIOGEL PI IND STRL 8 (GLOVE) ×2 IMPLANT
GLOVE SURG ORTHO 8.0 STRL STRW (GLOVE) ×4 IMPLANT
GOWN STRL REUS W/ TWL XL LVL3 (GOWN DISPOSABLE) ×4 IMPLANT
HEAD BIOLOX HIP 36/-2.5 (Joint) IMPLANT
HOLDER FOLEY CATH W/STRAP (MISCELLANEOUS) ×2 IMPLANT
HOOD PEEL AWAY T7 (MISCELLANEOUS) ×6 IMPLANT
INSERT TRIDENT POLY 36 0DEG (Insert) IMPLANT
KIT BASIN OR (CUSTOM PROCEDURE TRAY) ×2 IMPLANT
KIT TURNOVER KIT A (KITS) ×2 IMPLANT
MANIFOLD NEPTUNE II (INSTRUMENTS) ×2 IMPLANT
MARKER SKIN DUAL TIP RULER LAB (MISCELLANEOUS) ×2 IMPLANT
NDL SAFETY ECLIPSE 18X1.5 (NEEDLE) ×4 IMPLANT
PACK TOTAL JOINT (CUSTOM PROCEDURE TRAY) ×2 IMPLANT
PAD ARMBOARD POSITIONER FOAM (MISCELLANEOUS) ×2 IMPLANT
PENCIL SMOKE EVACUATOR (MISCELLANEOUS) ×2 IMPLANT
PRESSURIZER FEMORAL UNIV (MISCELLANEOUS) IMPLANT
PROTECTOR NERVE ULNAR (MISCELLANEOUS) IMPLANT
RETRIEVER SUT HEWSON (MISCELLANEOUS) ×2 IMPLANT
SCREW HEX LP 6.5X20 (Screw) IMPLANT
SCREW HEX LP 6.5X25 (Screw) IMPLANT
SEALER BIPOLAR AQUA 6.0 (INSTRUMENTS) IMPLANT
SET HNDPC FAN SPRY TIP SCT (DISPOSABLE) ×2 IMPLANT
SHELL CLUSTERHOLE ACETABULAR 5 (Shell) IMPLANT
SOLUTION IRRIG SURGIPHOR (IV SOLUTION) IMPLANT
SPIKE FLUID TRANSFER (MISCELLANEOUS) ×2 IMPLANT
STEM HIGH OFFSET SZ3 36X101 (Stem) IMPLANT
SUCTION TUBE FRAZIER 12FR DISP (SUCTIONS) ×2 IMPLANT
SUT BONE WAX W31G (SUTURE) ×2 IMPLANT
SUT ETHIBOND #5 BRAIDED 30INL (SUTURE) ×2 IMPLANT
SUT STRATAFIX 14 PDO 48 VLT (SUTURE) ×2 IMPLANT
SUT VIC AB 2-0 CT2 27 (SUTURE) ×2 IMPLANT
SUTURE STRATFX 0 PDS 27 VIOLET (SUTURE) ×2 IMPLANT
SUTURE STRATFX MNCRL+ 3-0 PS-2 (SUTURE) ×2 IMPLANT
SYR 30ML LL (SYRINGE) ×2 IMPLANT
SYR 50ML LL SCALE MARK (SYRINGE) ×2 IMPLANT
TOWEL GREEN STERILE FF (TOWEL DISPOSABLE) ×2 IMPLANT
TOWEL OR DSP ST BLU DLX 10/PK (DISPOSABLE) ×2 IMPLANT
TOWER CARTRIDGE SMART MIX (DISPOSABLE) IMPLANT
TRAY FOLEY MTR SLVR 16FR STAT (SET/KITS/TRAYS/PACK) IMPLANT
TUBE SUCTION HIGH CAP CLEAR NV (SUCTIONS) ×2 IMPLANT
UNDERPAD 30X36 HEAVY ABSORB (UNDERPADS AND DIAPERS) ×2 IMPLANT
WATER STERILE IRR 1000ML POUR (IV SOLUTION) ×4 IMPLANT

## 2024-02-18 NOTE — Op Note (Signed)
 02/18/2024  2:30 PM  PATIENT:  Samantha [name]   MRN: [id number]  PRE-OPERATIVE DIAGNOSIS:   End-stage right hip osteoarthritis  POST-OPERATIVE DIAGNOSIS:  same  PROCEDURE: Right total hip arthroplasty    PREOPERATIVE INDICATIONS:    Samantha [name] is an 63 y.o. female who has a diagnosis of End-stage right hip osteoarthritis and elected for surgical management after failing conservative treatment.  The risks benefits and alternatives were discussed with the patient including but not limited to the risks of nonoperative treatment, versus surgical intervention including infection, bleeding, nerve injury, periprosthetic fracture, the need for revision surgery, dislocation, leg length discrepancy, blood clots, cardiopulmonary complications, morbidity, mortality, among others, and they were willing to proceed.     OPERATIVE REPORT     SURGEON:  Toribio Higashi, MD    ASSISTANT: Bernarda Mclean, PA-C, (Present throughout the entire procedure,  necessary for completion of procedure in a timely manner, assisting with retraction, instrumentation, and closure)     ANESTHESIA: Spinal  ESTIMATED BLOOD LOSS: 300cc    COMPLICATIONS:  None.      COMPONENTS:   Stryker Trident 2 52 mm acetabular shell, 6.5 hex screws x 2, insignia high offset stem size 3, 36-2.5 mm ceramic head Implant Name Type Inv. Item Serial No. Manufacturer Lot No. LRB No. Used Action  SHELL CLUSTERHOLE ACETABULAR 5 - [id number] Shell SHELL CLUSTERHOLE ACETABULAR 5  [employer] [id number] Right 1 Implanted  INSERT TRIDENT POLY 36 0DEG - [id number] Insert INSERT TRIDENT POLY 36 0DEG  [employer] [id number] Right 1 Implanted  SCREW HEX LP 6.5X20 - [id number] Screw SCREW HEX LP 6.5X20  [employer] NFUE Right 1 Implanted  SCREW HEX LP 6.5X25 - [id number] Screw SCREW HEX LP 6.5X25  [employer] NFMA Right 1 Implanted  STEM HIGH OFFSET SZ3 36X101 - [id number] Stem STEM HIGH OFFSET SZ3 36X101   [employer] [id number] Right 1 Implanted  HEAD BIOLOX HIP 36/-2.5 - [id number] Joint HEAD BIOLOX HIP 36/-2.5  [employer] [id number] Right 1 Implanted    The aquamantis was utilized for this case to help facilitate better hemostasis as patient was felt to be at increased risk of bleeding because of obesity.        PROCEDURE IN DETAIL:   The patient was met in the holding area and  identified.  The appropriate hip was identified and marked at the operative site.  The patient was then transported to the OR  and  placed under anesthesia.  At that point, the patient was  placed in the lateral decubitus position with the operative side up and  secured to the operating room table  and all bony prominences padded. A subaxillary role was also placed.    The operative lower extremity was prepped from the iliac crest to the distal leg.  Sterile draping was performed.  Preoperative antibiotics, 2 gm of ancef,1 gm of Tranexamic Acid, and 8 mg of Decadron administered. Time out was performed prior to incision.      A routine posterolateral approach was utilized via sharp dissection  carried down to the subcutaneous tissue.  Gross bleeders were Bovie coagulated.  The iliotibial band was identified and incised along the length of the skin incision through the glute max fascia.  Charnley retractor was placed with care to protect the sciatic nerve posteriorly.  With the hip internally rotated, the piriformis tendon was identified and released from the femoral insertion and tagged with a #5 Ethibond.  A capsulotomy was then performed off the  femoral insertion and also tagged with a #5 Ethibond.    The femoral neck was exposed, and I resected the femoral neck based on preoperative templating relative to the lesser trochanter.    I then exposed the deep acetabulum, cleared out any tissue including the ligamentum teres.  After adequate visualization, I excised the labrum.  I then started reaming with a 46  mm reamer, first medializing to the floor of the cotyloid fossa, and then in the position of the cup aiming towards the greater sciatic notch, matching the version of the transverse acetabular ligament and tucked under the anterior wall. I reamed up to 52 mm reamer with good bony bed preparation and a 52 mm cup was chosen.  The real cup was then impacted into place.  Appropriate version and inclination was confirmed clinically matching their bony anatomy, and also with the use of the jig.  I placed 2 screws in the posterior superior quadrant to augment fixation.  A neutral liner was placed and impacted. It was confirmed to be appropriately seated and the acetabular retractors were removed.    I then prepared the proximal femur using the box cutter, Charnley awl, and then sequentially broached starting with 0 up to a size 3.  A trial broach, neck, and head was utilized, and I reduced the hip and it was found to have excellent stability.  There was no impingement with full extension and 90 degrees external rotation.  The hip was stable at the position of sleep and with 90 degrees flexion and 70degrees of internal rotation.  Leg lengths were also clinically assessed in the lateral position and felt to be equal. Intra-Op flatplate was obtained and confirmed appropriate component positions.  Good fill of the femur with the size 3 broach.  And restoration of leg length and offset. No evidence or concern for fracture.  A final femoral prosthesis size 3 was selected. I then impacted the real femoral prosthesis into place.I again trialed and selected a 36 -2.40mm ball. The hip was then reduced and taken through a range of motion. There was no impingement with full extension and 90 degrees external rotation.  The hip was stable at the position of sleep and with 90 degrees flexion and 70 degrees of internal rotation. Leg lengths were  again assessed and felt to be restored.  We then opened, and I impacted the real head  ball into place.  The posterior capsule was then closed with #5 Ethibond.  The piriformis was repaired through the base of the abductor tendon using a [place] suture passer.  I then irrigated the hip copiously with dilute Betadine  and with normal saline pulse lavage. Periarticular injection was then performed with Exparel .   We repaired the fascia #1 barbed suture, followed by 0 barbed suture for the subcutaneous fat.  Skin was closed with 2-0 Vicryl and 3-0 Monocryl.  Dermabond and Aquacel dressing were applied. The patient was then awakened and returned to PACU in stable and satisfactory condition.  Leg lengths in the supine position were assessed and felt to be clinically equal. There were no complications.  Post op recs: WB: WBAT RLE, No formal hip precautions Abx: ancef  Imaging: PACU pelvis Xray Dressing: Aquacell, keep intact until follow up DVT prophylaxis: Aspirin  81BID starting POD1 Follow up: 2 weeks after surgery for a wound check with Dr. Edna at [facility].  Address: [address], [place], [place] [address]  Office Phone: [phone]   Toribio Edna,  MD Orthopedic Surgeon

## 2024-02-18 NOTE — Plan of Care (Signed)
  Problem: Education: Goal: Knowledge of the prescribed therapeutic regimen will improve Outcome: Progressing Goal: Understanding of discharge needs will improve Outcome: Progressing Goal: Individualized Educational Video(s) Outcome: Progressing   Problem: Activity: Goal: Ability to avoid complications of mobility impairment will improve Outcome: Progressing Goal: Ability to tolerate increased activity will improve Outcome: Progressing   Problem: Clinical Measurements: Goal: Postoperative complications will be avoided or minimized Outcome: Progressing   Problem: Pain Management: Goal: Pain level will decrease with appropriate interventions Outcome: Progressing   Problem: Skin Integrity: Goal: Will show signs of wound healing Outcome: Progressing   Problem: Education: Goal: Knowledge of General Education information will improve Description: Including pain rating scale, medication(s)/side effects and non-pharmacologic comfort measures Outcome: Progressing   Problem: Health Behavior/Discharge Planning: Goal: Ability to manage health-related needs will improve Outcome: Progressing   Problem: Clinical Measurements: Goal: Ability to maintain clinical measurements within normal limits will improve Outcome: Progressing Goal: Will remain free from infection Outcome: Progressing Goal: Diagnostic test results will improve Outcome: Progressing Goal: Respiratory complications will improve Outcome: Progressing Goal: Cardiovascular complication will be avoided Outcome: Progressing   Problem: Activity: Goal: Risk for activity intolerance will decrease Outcome: Progressing   Problem: Nutrition: Goal: Adequate nutrition will be maintained Outcome: Progressing   Problem: Coping: Goal: Level of anxiety will decrease Outcome: Progressing   Problem: Elimination: Goal: Will not experience complications related to bowel motility Outcome: Progressing Goal: Will not experience  complications related to urinary retention Outcome: Progressing   Problem: Pain Managment: Goal: General experience of comfort will improve and/or be controlled Outcome: Progressing   Problem: Safety: Goal: Ability to remain free from injury will improve Outcome: Progressing   Problem: Skin Integrity: Goal: Risk for impaired skin integrity will decrease Outcome: Progressing

## 2024-02-18 NOTE — Transfer of Care (Signed)
 Immediate Anesthesia Transfer of Care Note  Patient: Samantha [name]  Procedure(s) Performed: ARTHROPLASTY, HIP, TOTAL,POSTERIOR APPROACH (Right: Hip)  Patient Location: PACU  Anesthesia Type:Spinal and MAC combined with regional for post-op pain  Level of Consciousness: awake, alert , and oriented  Airway & Oxygen Therapy: Patient Spontanous Breathing and Patient connected to face mask oxygen  Post-op Assessment: Report given to RN and Post -op Vital signs reviewed and stable  Post vital signs: Reviewed and stable  Last Vitals:  Vitals Value Taken Time  BP 116/65 02/18/24 14:51  Temp    Pulse 82 02/18/24 14:52  Resp 17 02/18/24 14:52  SpO2 100 % 02/18/24 14:52  Vitals shown include unfiled device data.  Last Pain:  Vitals:   02/18/24 1053  TempSrc:   PainSc: 0-No pain         Complications: No notable events documented.

## 2024-02-18 NOTE — Anesthesia Procedure Notes (Signed)
 Spinal  Patient location during procedure: OR End time: 02/18/2024 12:40 PM Reason for block: surgical anesthesia Staffing Performed: resident/CRNA  Anesthesiologist: Keneth Lynwood POUR, MD Resident/CRNA: Zulema Leita PARAS, CRNA Performed by: Zulema Leita PARAS, CRNA Authorized by: Keneth Lynwood POUR, MD   Preanesthetic Checklist Completed: patient identified, IV checked, site marked, risks and benefits discussed, surgical consent, monitors and equipment checked, pre-op evaluation and timeout performed Spinal Block Patient position: sitting Prep: DuraPrep Patient monitoring: heart rate, cardiac monitor, continuous pulse ox and blood pressure Approach: midline Location: L3-4 Injection technique: single-shot Needle Needle type: Pencan  Needle gauge: 24 G Needle length: 10 cm Assessment Sensory level: T4 Events: CSF return Additional Notes IV functioning, monitors applied to pt. Expiration date of kit checked and confirmed to be in date. Sterile prep and drape, hand hygiene and sterile gloves used. Pt was positioned and spine was prepped in sterile fashion. Skin was anesthetized with lidocaine . Free flow of clear CSF obtained prior to injecting local anesthetic into CSF. Spinal needle aspirated freely following injection. Needle was carefully withdrawn, and pt tolerated procedure well. Loss of motor and sensory on exam post injection. Dr Keneth at bedside during entire placement.

## 2024-02-18 NOTE — Evaluation (Signed)
 Physical Therapy Evaluation Patient Details Name: Samantha [name] MRN: [id number] DOB: Mar 03, 1961 Today's Date: 02/18/2024  History of Present Illness  63 yo female presents to therapy s/p R THA posterior approach on 02/18/2024 due to failure of conservative measures. Pt is currently R LE WBAT and no formal hip precautions. Pt PMH includes but is not limited to: HTN, HDL, GERD, vertigo, OSA, hypothyroidism, ADD, colon ca, and CAD.  Clinical Impression    Samantha [name] is a 63 y.o. female POD 0 s/p R THA. Patient reports mod I  with mobility at baseline. Patient is now limited by functional impairments (see PT problem list below) and requires CGA for bed mobility and CGA for transfers. Patient was able to ambulate 50 feet with RW and CGA level of assist. Patient instructed in exercise to facilitate ROM and circulation to manage edema.  Patient will benefit from continued skilled PT interventions to address impairments and progress towards PLOF. Acute PT will follow to progress mobility and stair training in preparation for safe discharge home with family and social support with OPPT services.       If plan is discharge home, recommend the following: A little help with walking and/or transfers;A little help with bathing/dressing/bathroom;Assistance with cooking/housework;Assist for transportation;Help with stairs or ramp for entrance   Can travel by private vehicle        Equipment Recommendations Rolling walker (2 wheels)  Recommendations for Other Services       Functional Status Assessment Patient has had a recent decline in their functional status and demonstrates the ability to make significant improvements in function in a reasonable and predictable amount of time.     Precautions / Restrictions Precautions Precautions: Fall Restrictions Weight Bearing Restrictions Per Provider Order: No RLE Weight Bearing Per Provider Order: Weight bearing as tolerated      Mobility   Bed Mobility Overal bed mobility: Needs Assistance Bed Mobility: Supine to Sit     Supine to sit: Contact guard, HOB elevated, Used rails     General bed mobility comments: min cues    Transfers Overall transfer level: Needs assistance Equipment used: Rolling walker (2 wheels) Transfers: Sit to/from Stand Sit to Stand: Contact guard assist           General transfer comment: min cues    Ambulation/Gait Ambulation/Gait assistance: Contact guard assist Gait Distance (Feet): 50 Feet Assistive device: Rolling walker (2 wheels) Gait Pattern/deviations: Step-to pattern, Decreased stance time - right, Antalgic, Trunk flexed Gait velocity: decreased     General Gait Details: slight trunk flexion with b UE support at RW to offlload R LE in stance phase, min cues for R LE WBAT and safety with RW management  Stairs            Wheelchair Mobility     Tilt Bed    Modified Rankin (Stroke Patients Only)       Balance Overall balance assessment: Needs assistance Sitting-balance support: Feet supported Sitting balance-Leahy Scale: Good     Standing balance support: Bilateral upper extremity supported, During functional activity, Reliant on assistive device for balance Standing balance-Leahy Scale: Poor                               Pertinent Vitals/Pain Pain Assessment Pain Assessment: 0-10 Pain Score: 5  Pain Location: R hip and LE Pain Descriptors / Indicators: Aching, Constant, Grimacing, Operative site guarding, Throbbing, Tightness, Sore Pain Intervention(s): Limited  activity within patient's tolerance, Monitored during session, Premedicated before session, Repositioned, Ice applied    Home Living Family/patient expects to be discharged to:: Private residence Living Arrangements: Alone Available Help at Discharge: Family Type of Home: House Home Access: Stairs to enter Entrance Stairs-Rails: None Entrance Stairs-Number of Steps:  2 Alternate Level Stairs-Number of Steps: flight Home Layout: Two level;Bed/bath upstairs Home Equipment: Cane - single point      Prior Function Prior Level of Function : Independent/Modified Independent;Driving             Mobility Comments: mod I with use of tritip cane for all ADLs, self care tasks and IADLs,       Extremity/Trunk Assessment        Lower Extremity Assessment Lower Extremity Assessment: RLE deficits/detail RLE Deficits / Details: ankle DF/PF 5/5 RLE Sensation: WNL    Cervical / Trunk Assessment Cervical / Trunk Assessment: Normal  Communication   Communication Communication: No apparent difficulties    Cognition Arousal: Alert Behavior During Therapy: WFL for tasks assessed/performed   PT - Cognitive impairments: No apparent impairments                         Following commands: Intact       Cueing       General Comments      Exercises Total Joint Exercises Ankle Circles/Pumps: AROM, Both, 10 reps   Assessment/Plan    PT Assessment Patient needs continued PT services  PT Problem List Decreased strength;Decreased range of motion;Decreased activity tolerance;Decreased balance;Decreased mobility;Decreased coordination;Pain       PT Treatment Interventions DME instruction;Gait training;Stair training;Functional mobility training;Therapeutic exercise;Therapeutic activities;Balance training;Neuromuscular re-education;Patient/family education;Modalities    PT Goals (Current goals can be found in the Care Plan section)  Acute Rehab PT Goals Patient Stated Goal: to be able to walk no pain, go to the gym and navigate steps with a reciprocal pattern PT Goal Formulation: With patient Time For Goal Achievement: 03/03/24 Potential to Achieve Goals: Good    Frequency 7X/week     Co-evaluation               AM-PAC PT 6 Clicks Mobility  Outcome Measure Help needed turning from your back to your side while in a flat  bed without using bedrails?: None Help needed moving from lying on your back to sitting on the side of a flat bed without using bedrails?: A Little Help needed moving to and from a bed to a chair (including a wheelchair)?: A Little Help needed standing up from a chair using your arms (e.g., wheelchair or bedside chair)?: A Little Help needed to walk in hospital room?: A Little Help needed climbing 3-5 steps with a railing? : A Lot 6 Click Score: 18    End of Session Equipment Utilized During Treatment: Gait belt Activity Tolerance: No increased pain;Patient tolerated treatment well Patient left: in chair;with call bell/phone within reach;with family/visitor present Nurse Communication: Mobility status PT Visit Diagnosis: Unsteadiness on feet (R26.81);Other abnormalities of gait and mobility (R26.89);Muscle weakness (generalized) (M62.81);Difficulty in walking, not elsewhere classified (R26.2);Pain Pain - Right/Left: Right Pain - part of body: Hip;Leg    Time: 8191-8167 PT Time Calculation (min) (ACUTE ONLY): 24 min   Charges:   PT Evaluation $PT Eval Low Complexity: 1 Low PT Treatments $Gait Training: 8-22 mins PT General Charges $$ ACUTE PT VISIT: 1 Visit         [place], PT Acute Rehab   [place]  H Karanveer Ramakrishnan 02/18/2024, 7:14 PM

## 2024-02-18 NOTE — Interval H&P Note (Signed)
The patient has been re-examined, and the chart reviewed, and there have been no interval changes to the documented history and physical.    Plan for R THA for R hip OA  The operative side was examined and the patient was confirmed to have sensation to DPN, SPN, TN intact, Motor EHL, ext, flex 5/5, and DP 2+, PT 2+, No significant edema.   The risks, benefits, and alternatives have been discussed at length with patient, and the patient is willing to proceed.  Right hip marked. Consent has been signed.

## 2024-02-18 NOTE — Discharge Instructions (Signed)

## 2024-02-18 NOTE — Anesthesia Procedure Notes (Signed)
 Procedure Name: MAC Date/Time: 02/18/2024 12:31 PM  Performed by: Zulema Leita PARAS, CRNAPre-anesthesia Checklist: Patient identified, Emergency Drugs available, Suction available and Patient being monitored Oxygen Delivery Method: Simple face mask

## 2024-02-19 ENCOUNTER — Other Ambulatory Visit (HOSPITAL_BASED_OUTPATIENT_CLINIC_OR_DEPARTMENT_OTHER): Payer: Self-pay

## 2024-02-19 ENCOUNTER — Encounter (HOSPITAL_COMMUNITY): Payer: Self-pay | Admitting: Orthopedic Surgery

## 2024-02-19 DIAGNOSIS — M1611 Unilateral primary osteoarthritis, right hip: Secondary | ICD-10-CM | POA: Diagnosis not present

## 2024-02-19 LAB — CBC
HCT: 34.7 % — ABNORMAL LOW (ref 36.0–46.0)
Hemoglobin: 11.7 g/dL — ABNORMAL LOW (ref 12.0–15.0)
MCH: 28.7 pg (ref 26.0–34.0)
MCHC: 33.7 g/dL (ref 30.0–36.0)
MCV: 85.3 fL (ref 80.0–100.0)
Platelets: 265 K/uL (ref 150–400)
RBC: 4.07 MIL/uL (ref 3.87–5.11)
RDW: 13.4 % (ref 11.5–15.5)
WBC: 11.5 K/uL — ABNORMAL HIGH (ref 4.0–10.5)
nRBC: 0 % (ref 0.0–0.2)

## 2024-02-19 LAB — BASIC METABOLIC PANEL WITH GFR
Anion gap: 10 (ref 5–15)
BUN: 11 mg/dL (ref 8–23)
CO2: 25 mmol/L (ref 22–32)
Calcium: 9.3 mg/dL (ref 8.9–10.3)
Chloride: 101 mmol/L (ref 98–111)
Creatinine, Ser: 0.78 mg/dL (ref 0.44–1.00)
GFR, Estimated: >60 mL/min (ref >60)
Glucose, Bld: 131 mg/dL — ABNORMAL HIGH (ref 70–99)
Potassium: 3.8 mmol/L (ref 3.5–5.1)
Sodium: 136 mmol/L (ref 135–145)

## 2024-02-19 NOTE — TOC Transition Note (Signed)
 Transition of Care [facility]) - Discharge Note   Patient Details  Name: Samantha [name] MRN: [id number] Date of Birth: 1960/12/28  [facility] CM/SW Contact:  NORMAN ASPEN, LCSW Phone Number: 02/19/2024, 9:38 AM   Clinical Narrative:     Met with pt and confirming she has received RW to room via [employer].  Pt confirms OPPT already arranged with Beverley Millman PT.  No further IP CM needs.  Final next level of care: OP Rehab Barriers to Discharge: No Barriers Identified   Patient Goals and CMS Choice Patient states their goals for this hospitalization and ongoing recovery are:: return home          Discharge Placement                       Discharge Plan and Services Additional resources added to the After Visit Summary for                  DME Arranged: Walker rolling DME Agency: [employer]                  Social Drivers of Health (SDOH) Interventions SDOH Screenings   Food Insecurity: No Food Insecurity (02/18/2024)  Housing: Low Risk  (02/18/2024)  Transportation Needs: No Transportation Needs (02/18/2024)  Utilities: Not At Risk (02/18/2024)  Social Connections: Unknown (02/18/2024)  Tobacco Use: Low Risk  (02/18/2024)     Readmission Risk Interventions     No data to display

## 2024-02-19 NOTE — Progress Notes (Signed)
     Subjective:  Patient reports pain as mild.  Doing very well so far.  No issues overnight.  Mobilized 50 feet with physical therapy yesterday.  Plan for discharge home today following therapy.  Yesterday's total administered Morphine Milligram Equivalents: 45   Objective:   VITALS:   Vitals:   02/18/24 1843 02/18/24 1916 02/19/24 0105 02/19/24 0644  BP: (!) 144/81 135/72 110/62 107/61  Pulse: 86 85 61 60  Resp: 18 16 15 16   Temp: 98.6 F (37 C) 98.1 F (36.7 C) 98.9 F (37.2 C) 98.1 F (36.7 C)  TempSrc: Oral Oral Oral Oral  SpO2: 99% 96% 97% 98%  Weight:      Height:        Sensation intact distally Intact pulses distally Dorsiflexion/Plantar flexion intact Incision: dressing C/D/I Compartment soft    Lab Results  Component Value Date   WBC 11.5 (H) 02/19/2024   HGB 11.7 (L) 02/19/2024   HCT 34.7 (L) 02/19/2024   MCV 85.3 02/19/2024   PLT 265 02/19/2024   BMET    Component Value Date/Time   NA 136 02/19/2024 0342   NA 141 01/11/2024 0833   K 3.8 02/19/2024 0342   CL 101 02/19/2024 0342   CO2 25 02/19/2024 0342   GLUCOSE 131 (H) 02/19/2024 0342   BUN 11 02/19/2024 0342   BUN 9 01/11/2024 0833   CREATININE 0.78 02/19/2024 0342   CALCIUM  9.3 02/19/2024 0342   EGFR 96 01/11/2024 0833   GFRNONAA >60 02/19/2024 0342      Xray: Total hip arthroplasty components in good position no adverse features  Assessment/Plan: 1 Day Post-Op   Principal Problem:   Primary osteoarthritis of right hip  S/p R THA 02/18/24   Post op recs: WB: WBAT RLE, No formal hip precautions Abx: ancef Imaging: PACU pelvis Xray Dressing: Aquacell, keep intact until follow up DVT prophylaxis: Aspirin 81BID starting POD1 Follow up: 2 weeks after surgery for a wound check with Dr. Edna at [facility].  Address: [address], [place], [place] [address]  Office Phone: [phone]        Samantha [name] [name] 02/19/2024, 7:05 AM   Samantha Edna, MD  Contact information:   [phone] 7am-5pm epic message Dr. Edna, or call office for patient follow up: [phone] After hours and holidays please check [email or web address] for group call information for [facility]

## 2024-02-19 NOTE — Discharge Summary (Signed)
 Physician Discharge Summary  Patient ID: Tanishia Lemaster MRN: [id number] DOB/AGE: Sep 14, 1960 63 y.o.  Admit date: 02/18/2024 Discharge date: 02/19/2024  Admission Diagnoses:  Primary osteoarthritis of right hip  Discharge Diagnoses:  Principal Problem:   Primary osteoarthritis of right hip   Past Medical History:  Diagnosis Date   ADD (attention deficit disorder)    Anemia    Arthritis of both knees    Coronary artery disease    Edema of both lower extremities    Essential hypertension 04/20/2019   GERD (gastroesophageal reflux disease) 04/20/2019   History of hysterectomy    patrial hysterectomy   Hyperlipidemia    Hypothyroidism (acquired)    Multiple thyroid  nodules   Joint pain    OSA (obstructive sleep apnea) 11/11/2021   Pure hypercholesterolemia 08/11/2021   Snoring 08/11/2021   Thyroid  disease    Hypothyroidism   Vertigo     Surgeries: Procedure(s): ARTHROPLASTY, HIP, TOTAL,POSTERIOR APPROACH on 02/18/2024   Consultants (if any):   Discharged Condition: Improved  Hospital Course: Arnetia Bronk is an 63 y.o. female who was admitted 02/18/2024 with a diagnosis of Primary osteoarthritis of right hip and went to the operating room on 02/18/2024 and underwent the above named procedures.    She was given perioperative antibiotics:  Anti-infectives (From admission, onward)    Start     Dose/Rate Route Frequency Ordered Stop   02/18/24 1830  ceFAZolin (ANCEF) IVPB 2g/100 mL premix        2 g 200 mL/hr over 30 Minutes Intravenous Every 6 hours 02/18/24 1600 02/19/24 0009   02/18/24 1045  ceFAZolin (ANCEF) IVPB 2g/100 mL premix        2 g 200 mL/hr over 30 Minutes Intravenous On call to O.R. 02/18/24 1030 02/18/24 1241   02/18/24 1030  valACYclovir  (VALTREX ) tablet 1,000 mg       Note to Pharmacy: Seven day therapy     1,000 mg Oral 2 times daily PRN 02/18/24 1030       .  She was given sequential compression devices, early ambulation, and aspirin  for DVT prophylaxis.  She benefited maximally from the hospital stay and there were no complications.    Recent vital signs:  Vitals:   02/19/24 0105 02/19/24 0644  BP: 110/62 107/61  Pulse: 61 60  Resp: 15 16  Temp: 98.9 F (37.2 C) 98.1 F (36.7 C)  SpO2: 97% 98%    Recent laboratory studies:  Lab Results  Component Value Date   HGB 11.7 (L) 02/19/2024   HGB 13.3 02/05/2024   HGB 13.0 02/07/2022   Lab Results  Component Value Date   WBC 11.5 (H) 02/19/2024   PLT 265 02/19/2024   No results found for: INR Lab Results  Component Value Date   NA 136 02/19/2024   K 3.8 02/19/2024   CL 101 02/19/2024   CO2 25 02/19/2024   BUN 11 02/19/2024   CREATININE 0.78 02/19/2024   GLUCOSE 131 (H) 02/19/2024    Discharge Medications:   Allergies as of 02/19/2024   No Known Allergies      Medication List     STOP taking these medications    ibuprofen 200 MG tablet Commonly known as: ADVIL       TAKE these medications    acetaminophen  500 MG tablet Commonly known as: TYLENOL  Take 2 tablets (1,000 mg total) by mouth every 8 (eight) hours as needed. What changed:  how much to take how to take this when  to take this reasons to take this additional instructions   aspirin EC 81 MG tablet Take 1 tablet (81 mg total) by mouth 2 (two) times daily for 28 days. Swallow whole. What changed: when to take this   CALCIUM  PO Take by mouth.   celecoxib  100 MG capsule Commonly known as: CeleBREX  Take 1 capsule (100 mg total) by mouth 2 (two) times daily for 14 days.   Centrum Silver Women 50+ Tabs Take 1 tablet by mouth daily.   chlorhexidine  4 % external liquid Commonly known as: HIBICLENS Apply 15 mLs (1 Application total) topically as directed for 5 days every other week for 6 weeks.   Compact [employer]ist Use with albuterol  inhaler as directed   cyanocobalamin 1000 MCG tablet Commonly known as: VITAMIN B12 Take 1,000 mcg by mouth daily.    hydrochlorothiazide  25 MG tablet Commonly known as: HYDRODIURIL  Take 1 tablet (25 mg total) by mouth daily.   levothyroxine  88 MCG tablet Commonly known as: SYNTHROID  Take 1 tablet (88 mcg total) by mouth daily. Take on an empty stomach   Magnesium Glycinate 100 MG Caps Take 200 mg by mouth daily.   meclizine  25 MG tablet Commonly known as: ANTIVERT  Take 1 tablet (25 mg total) by mouth every 12 (twelve) hours as needed for vertigo symptoms.   methocarbamol 500 MG tablet Commonly known as: ROBAXIN Take 1 tablet (500 mg total) by mouth every 8 (eight) hours as needed for up to 10 days for muscle spasms.   mupirocin ointment 2 % Commonly known as: BACTROBAN Place 1 Application into the nose 2 (two) times daily for 60 doses. Use as directed for 5 days every other week for 6 weeks.   omeprazole 40 MG capsule Commonly known as: PRILOSEC Take 1 capsule (40 mg total) by mouth daily for 21 days.   ondansetron  4 MG tablet Commonly known as: Zofran  Take 1 tablet (4 mg total) by mouth every 8 (eight) hours as needed for up to 14 days for nausea or vomiting.   oxyCODONE 5 MG immediate release tablet Commonly known as: Roxicodone Take 1 tablet (5 mg total) by mouth every 4 (four) hours as needed for up to 7 days for severe pain (pain score 7-10) or moderate pain (pain score 4-6).   polyethylene glycol powder 17 GM/SCOOP powder Commonly known as: GLYCOLAX/MIRALAX Take 17 g by mouth daily. Dissolve 1 capful (17g) in 4-8 ounces of liquid and take by mouth daily.   rosuvastatin  20 MG tablet Commonly known as: CRESTOR  Take 1 tablet (20 mg total) by mouth daily.   SYSTANE COMPLETE OP Apply 1 drop to eye daily.   TURMERIC PO Take 15 mLs by mouth daily. Suspension: Tumeric, Ginger, Black pepper, Lemon, Orange, Olive oil   valACYclovir  1000 MG tablet Commonly known as: VALTREX  Take 1,000 mg by mouth 2 (two) times daily as needed (cold sore). Seven day therapy   Vitamin D -3 125 MCG  (5000 UT) Tabs Take by mouth.        Diagnostic Studies: DG HIP UNILAT W OR W/O PELVIS 2-3 VIEWS RIGHT Result Date: 02/18/2024 CLINICAL DATA:  Postop. EXAM: DG HIP (WITH OR WITHOUT PELVIS) 2-3V RIGHT COMPARISON:  None Available. FINDINGS: Right hip arthroplasty in expected alignment. No periprosthetic lucency or fracture. Recent postsurgical change includes air and edema in the soft tissues. IMPRESSION: Right hip arthroplasty without immediate postoperative complication. Electronically Signed   By: Andrea Gasman M.D.   On: 02/18/2024 15:21   DG Pelvis  Portable Result Date: 02/18/2024 CLINICAL DATA:  Elective surgery. EXAM: PORTABLE PELVIS 1-2 VIEWS COMPARISON:  None Available. FINDINGS: Single cross-table lateral view of the pelvis submitted from the operating room. Imaging obtained during right hip arthroplasty. Portions of arthroplasty are in place. IMPRESSION: Intraoperative imaging during right hip arthroplasty. Electronically Signed   By: Andrea Gasman M.D.   On: 02/18/2024 15:21    Disposition: Discharge disposition: 01-Home or Self Care       Discharge Instructions     Call MD / Call 911   Complete by: As directed    If you experience chest pain or shortness of breath, CALL 911 and be transported to the hospital emergency room.  If you develope a fever above 101 F, pus (white drainage) or increased drainage or redness at the wound, or calf pain, call your surgeon's office.   Constipation Prevention   Complete by: As directed    Drink plenty of fluids.  Prune juice may be helpful.  You may use a stool softener, such as Colace (over the counter) 100 mg twice a day.  Use MiraLax (over the counter) for constipation as needed.   Diet - low sodium heart healthy   Complete by: As directed    Increase activity slowly as tolerated   Complete by: As directed    Post-operative opioid taper instructions:   Complete by: As directed    POST-OPERATIVE OPIOID TAPER INSTRUCTIONS: It  is important to wean off of your opioid medication as soon as possible. If you do not need pain medication after your surgery it is ok to stop day one. Opioids include: Codeine, Hydrocodone(Norco, Vicodin), Oxycodone(Percocet, oxycontin) and hydromorphone amongst others.  Long term and even short term use of opiods can cause: Increased pain response Dependence Constipation Depression Respiratory depression And more.  Withdrawal symptoms can include Flu like symptoms Nausea, vomiting And more Techniques to manage these symptoms Hydrate well Eat regular healthy meals Stay active Use relaxation techniques(deep breathing, meditating, yoga) Do Not substitute Alcohol to help with tapering If you have been on opioids for less than two weeks and do not have pain than it is ok to stop all together.  Plan to wean off of opioids This plan should start within one week post op of your joint replacement. Maintain the same interval or time between taking each dose and first decrease the dose.  Cut the total daily intake of opioids by one tablet each day Next start to increase the time between doses. The last dose that should be eliminated is the evening dose.      Walker standard   Complete by: As directed         Follow-up Information     Edna Toribio LABOR, MD Follow up in 2 week(s).   Specialty: Orthopedic Surgery Contact information: [address] [place] [address] [phone]                    Discharge Instructions      INSTRUCTIONS AFTER JOINT REPLACEMENT   Remove items at home which could result in a fall. This includes throw rugs or furniture in walking pathways ICE to the affected joint every three hours while awake for 30 minutes at a time, for at least the first 3-5 days, and then as needed for pain and swelling.  Continue to use ice for pain and swelling. You may notice swelling that will progress down to the foot and ankle.  This is  normal after surgery.  Elevate your leg when you are not up walking on it.   Continue to use the breathing machine you got in the hospital (incentive spirometer) which will help keep your temperature down.  It is common for your temperature to cycle up and down following surgery, especially at night when you are not up moving around and exerting yourself.  The breathing machine keeps your lungs expanded and your temperature down.  DIET:  As you were doing prior to hospitalization, we recommend a well-balanced diet.  DRESSING / WOUND CARE / SHOWERING:  Keep the surgical dressing until follow up.  The dressing is water proof, so you can shower without any extra covering.  IF THE DRESSING FALLS OFF or the wound gets wet inside, change the dressing with sterile gauze.  Please use good hand washing techniques before changing the dressing.  Do not use any lotions or creams on the incision until instructed by your surgeon.    ACTIVITY  Increase activity slowly as tolerated, but follow the weight bearing instructions below.   No driving for 6 weeks or until further direction given by your physician.  You cannot drive while taking narcotics.  No lifting or carrying greater than 10 lbs. until further directed by your surgeon. Avoid periods of inactivity such as sitting longer than an hour when not asleep. This helps prevent blood clots.  You may return to work once you are authorized by your doctor.   WEIGHT BEARING: Weight bearing as tolerated with assist device (walker, cane, etc) as directed, use it as long as suggested by your surgeon or therapist, typically at least 4-6 weeks.  EXERCISES  Results after joint replacement surgery are often greatly improved when you follow the exercise, range of motion and muscle strengthening exercises prescribed by your doctor. Safety measures are also important to protect the joint from further injury. Any time any of these exercises cause you to have increased pain or  swelling, decrease what you are doing until you are comfortable again and then slowly increase them. If you have problems or questions, call your caregiver or physical therapist for advice.   Rehabilitation is important following a joint replacement. After just a few days of immobilization, the muscles of the leg can become weakened and shrink (atrophy).  These exercises are designed to build up the tone and strength of the thigh and leg muscles and to improve motion. Often times heat used for twenty to thirty minutes before working out will loosen up your tissues and help with improving the range of motion but do not use heat for the first two weeks following surgery (sometimes heat can increase post-operative swelling).   These exercises can be done on a training (exercise) mat, on the floor, on a table or on a bed. Use whatever works the best and is most comfortable for you.    Use music or television while you are exercising so that the exercises are a pleasant break in your day. This will make your life better with the exercises acting as a break in your routine that you can look forward to.   Perform all exercises about fifteen times, three times per day or as directed.  You should exercise both the operative leg and the other leg as well.  Exercises include:   Quad Sets - Tighten up the muscle on the front of the thigh (Quad) and hold for 5-10 seconds.   Straight Leg Raises - With your knee straight (if  you were given a brace, keep it on), lift the leg to 60 degrees, hold for 3 seconds, and slowly lower the leg.  Perform this exercise against resistance later as your leg gets stronger.  Leg Slides: Lying on your back, slowly slide your foot toward your buttocks, bending your knee up off the floor (only go as far as is comfortable). Then slowly slide your foot back down until your leg is flat on the floor again.  Angel Wings: Lying on your back spread your legs to the side as far apart as you can  without causing discomfort.  Hamstring Strength:  Lying on your back, push your heel against the floor with your leg straight by tightening up the muscles of your buttocks.  Repeat, but this time bend your knee to a comfortable angle, and push your heel against the floor.  You may put a pillow under the heel to make it more comfortable if necessary.   A rehabilitation program following joint replacement surgery can speed recovery and prevent re-injury in the future due to weakened muscles. Contact your doctor or a physical therapist for more information on knee rehabilitation.   CONSTIPATION:  Constipation is defined medically as fewer than three stools per week and severe constipation as less than one stool per week.  Even if you have a regular bowel pattern at home, your normal regimen is likely to be disrupted due to multiple reasons following surgery.  Combination of anesthesia, postoperative narcotics, change in appetite and fluid intake all can affect your bowels.   YOU MUST use at least one of the following options; they are listed in order of increasing strength to get the job done.  They are all available over the counter, and you may need to use some, POSSIBLY even all of these options:    Drink plenty of fluids (prune juice may be helpful) and high fiber foods Colace 100 mg by mouth twice a day  Senokot for constipation as directed and as needed Dulcolax (bisacodyl), take with full glass of water  Miralax (polyethylene glycol) once or twice a day as needed.  If you have tried all these things and are unable to have a bowel movement in the first 3-4 days after surgery call either your surgeon or your primary doctor.    If you experience loose stools or diarrhea, hold the medications until you stool forms back up.  If your symptoms do not get better within 1 week or if they get worse, check with your doctor.  If you experience the worst abdominal pain ever or develop nausea or vomiting,  please contact the office immediately for further recommendations for treatment.  ITCHING:  If you experience itching with your medications, try taking only a single pain pill, or even half a pain pill at a time.  You can also use Benadryl over the counter for itching or also to help with sleep.   TED HOSE STOCKINGS:  Use stockings on both legs until for at least 2 weeks or as directed by physician office. They may be removed at night for sleeping.  MEDICATIONS:  See your medication summary on the "After Visit Summary" that nursing will review with you.  You may have some home medications which will be placed on hold until you complete the course of blood thinner medication.  It is important for you to complete the blood thinner medication as prescribed.  Blood clot prevention (DVT Prophylaxis): After surgery you are at an increased risk  for a blood clot.  You were prescribed a blood thinner, Aspirin 81mg , to be taken twice daily for a total of 4 weeks from surgery to help reduce your risk of getting a blood clot.  Signs of a pulmonary embolus (blood clot in the lungs) include sudden short of breath, feeling lightheaded or dizzy, chest pain with a deep breath, rapid pulse rapid breathing.  Signs of a blood clot in your arms or legs include new unexplained swelling and cramping, warm, red or darkened skin around the painful area.  Please call the office or 911 right away if these signs or symptoms develop.  PRECAUTIONS:   If you experience chest pain or shortness of breath - call 911 immediately for transfer to the hospital emergency department.   If you develop a fever greater that 101 F, purulent drainage from wound, increased redness or drainage from wound, foul odor from the wound/dressing, or calf pain - CONTACT YOUR SURGEON.                                                   FOLLOW-UP APPOINTMENTS:  If you do not already have a post-op appointment, please call the office for an appointment to be  seen by your surgeon.  Guidelines for how soon to be seen are listed in your "After Visit Summary", but are typically between 2-3 weeks after surgery.  If you have a specialized bandage, you may be told to follow up 1 week after surgery.  POST-OPERATIVE OPIOID TAPER INSTRUCTIONS: It is important to wean off of your opioid medication as soon as possible. If you do not need pain medication after your surgery it is ok to stop day one. Opioids include: Codeine, Hydrocodone(Norco, Vicodin), Oxycodone(Percocet, oxycontin) and hydromorphone amongst others.  Long term and even short term use of opiods can cause: Increased pain response Dependence Constipation Depression Respiratory depression And more.  Withdrawal symptoms can include Flu like symptoms Nausea, vomiting And more Techniques to manage these symptoms Hydrate well Eat regular healthy meals Stay active Use relaxation techniques(deep breathing, meditating, yoga) Do Not substitute Alcohol to help with tapering If you have been on opioids for less than two weeks and do not have pain than it is ok to stop all together.  Plan to wean off of opioids This plan should start within one week post op of your joint replacement. Maintain the same interval or time between taking each dose and first decrease the dose.  Cut the total daily intake of opioids by one tablet each day Next start to increase the time between doses. The last dose that should be eliminated is the evening dose.   MAKE SURE YOU:  Understand these instructions.  Get help right away if you are not doing well or get worse.    Thank you for letting us  be a part of your medical care team.  It is a privilege we respect greatly.  We hope these instructions will help you stay on track for a fast and full recovery!            Signed: Careli Luzader A Tanner Vigna 02/19/2024, 7:07 AM

## 2024-02-19 NOTE — Anesthesia Postprocedure Evaluation (Signed)
 Anesthesia Post Note  Patient: Samantha [name]  Procedure(s) Performed: ARTHROPLASTY, HIP, TOTAL,POSTERIOR APPROACH (Right: Hip)     Patient location during evaluation: PACU Anesthesia Type: Spinal Level of consciousness: awake and alert Pain management: pain level controlled Vital Signs Assessment: post-procedure vital signs reviewed and stable Respiratory status: spontaneous breathing, nonlabored ventilation, respiratory function stable and patient connected to nasal cannula oxygen Cardiovascular status: blood pressure returned to baseline and stable Postop Assessment: no apparent nausea or vomiting Anesthetic complications: no   No notable events documented.  Last Vitals:  Vitals:   02/19/24 0105 02/19/24 0644  BP: 110/62 107/61  Pulse: 61 60  Resp: 15 16  Temp: 37.2 C 36.7 C  SpO2: 97% 98%    Last Pain:  Vitals:   02/19/24 0644  TempSrc: Oral  PainSc:                  Samantha [name]

## 2024-02-19 NOTE — Plan of Care (Signed)
  Problem: Education: Goal: Knowledge of the prescribed therapeutic regimen will improve 02/19/2024 0752 by Alaina Dozier PARAS, RN Outcome: Adequate for Discharge 02/19/2024 [phone] by Alaina Dozier PARAS, RN Outcome: Progressing Goal: Understanding of discharge needs will improve 02/19/2024 0752 by Alaina Dozier PARAS, RN Outcome: Adequate for Discharge 02/19/2024 0752 by Alaina Dozier PARAS, RN Outcome: Progressing Goal: Individualized Educational Video(s) 02/19/2024 0752 by Alaina Dozier PARAS, RN Outcome: Adequate for Discharge 02/19/2024 0752 by Alaina Dozier PARAS, RN Outcome: Progressing   Problem: Activity: Goal: Ability to avoid complications of mobility impairment will improve 02/19/2024 0752 by Alaina Dozier PARAS, RN Outcome: Adequate for Discharge 02/19/2024 [phone] by Alaina Dozier PARAS, RN Outcome: Progressing Goal: Ability to tolerate increased activity will improve Outcome: Adequate for Discharge   Problem: Clinical Measurements: Goal: Postoperative complications will be avoided or minimized Outcome: Adequate for Discharge   Problem: Pain Management: Goal: Pain level will decrease with appropriate interventions Outcome: Adequate for Discharge   Problem: Skin Integrity: Goal: Will show signs of wound healing Outcome: Adequate for Discharge   Problem: Education: Goal: Knowledge of General Education information will improve Description: Including pain rating scale, medication(s)/side effects and non-pharmacologic comfort measures Outcome: Adequate for Discharge   Problem: Health Behavior/Discharge Planning: Goal: Ability to manage health-related needs will improve Outcome: Adequate for Discharge   Problem: Clinical Measurements: Goal: Ability to maintain clinical measurements within normal limits will improve Outcome: Adequate for Discharge Goal: Will remain free from infection Outcome: Adequate for Discharge Goal: Diagnostic test results will  improve Outcome: Adequate for Discharge Goal: Respiratory complications will improve Outcome: Adequate for Discharge Goal: Cardiovascular complication will be avoided Outcome: Adequate for Discharge   Problem: Activity: Goal: Risk for activity intolerance will decrease Outcome: Adequate for Discharge   Problem: Nutrition: Goal: Adequate nutrition will be maintained Outcome: Adequate for Discharge   Problem: Coping: Goal: Level of anxiety will decrease Outcome: Adequate for Discharge   Problem: Elimination: Goal: Will not experience complications related to bowel motility Outcome: Adequate for Discharge Goal: Will not experience complications related to urinary retention Outcome: Adequate for Discharge   Problem: Pain Managment: Goal: General experience of comfort will improve and/or be controlled Outcome: Adequate for Discharge   Problem: Safety: Goal: Ability to remain free from injury will improve Outcome: Adequate for Discharge   Problem: Skin Integrity: Goal: Risk for impaired skin integrity will decrease Outcome: Adequate for Discharge

## 2024-02-19 NOTE — Progress Notes (Signed)
 Physical Therapy Treatment Patient Details Name: Samantha [name] MRN: [id number] DOB: 01-05-1961 Today's Date: 02/19/2024   History of Present Illness 63 yo female presents to therapy s/p R THA posterior approach on 02/18/2024 due to failure of conservative measures. Pt is currently R LE WBAT and no formal hip precautions. Pt PMH includes but is not limited to: HTN, HDL, GERD, vertigo, OSA, hypothyroidism, ADD, colon ca, and CAD.    PT Comments  The patient has progressed well and has met PT goals for DC to home with OPPT.      If plan is discharge home, recommend the following: A little help with walking and/or transfers;A little help with bathing/dressing/bathroom;Assistance with cooking/housework;Assist for transportation;Help with stairs or ramp for entrance   Can travel by private vehicle        Equipment Recommendations  Rolling walker (2 wheels)    Recommendations for Other Services       Precautions / Restrictions Precautions Precautions: Fall Restrictions RLE Weight Bearing Per Provider Order: Weight bearing as tolerated     Mobility  Bed Mobility               General bed mobility comments: in recliner    Transfers Overall transfer level: Needs assistance Equipment used: Rolling walker (2 wheels) Transfers: Sit to/from Stand Sit to Stand: Supervision           General transfer comment: cues for handd and RLE position    Ambulation/Gait Ambulation/Gait assistance: Supervision Gait Distance (Feet): 300 Feet Assistive device: Rolling walker (2 wheels) Gait Pattern/deviations: Step-to pattern, Step-through pattern Gait velocity: decreased     General Gait Details: cues for sequence   Stairs Stairs: Yes Stairs assistance: Contact guard assist Stair Management: Step to pattern, Forwards, With cane, One rail Right Number of Stairs: 2 General stair comments: cues for sequence. performed with no issue   Wheelchair Mobility     Tilt Bed     Modified Rankin (Stroke Patients Only)       Balance Overall balance assessment: Mild deficits observed, not formally tested                                          Communication    Cognition Arousal: Alert Behavior During Therapy: WFL for tasks assessed/performed   PT - Cognitive impairments: No apparent impairments                                Cueing    Exercises Total Joint Exercises Ankle Circles/Pumps: AROM Short Arc Quad: AROM, Right, 10 reps Heel Slides: AAROM, Right, 10 reps Hip ABduction/ADduction: AAROM, Right, 10 reps Long Arc Quad: AROM, Right, 10 reps    General Comments        Pertinent Vitals/Pain Pain Assessment Pain Score: 3  Pain Location: R hip and LE Pain Descriptors / Indicators: Aching, Constant, Grimacing, Operative site guarding, Sore Pain Intervention(s): Monitored during session, Premedicated before session    Home Living                          Prior Function            PT Goals (current goals can now be found in the care plan section) Progress towards PT goals: Progressing toward goals    Frequency  7X/week      PT Plan      Co-evaluation              AM-PAC PT 6 Clicks Mobility   Outcome Measure  Help needed turning from your back to your side while in a flat bed without using bedrails?: None Help needed moving from lying on your back to sitting on the side of a flat bed without using bedrails?: None Help needed moving to and from a bed to a chair (including a wheelchair)?: A Little Help needed standing up from a chair using your arms (e.g., wheelchair or bedside chair)?: A Little Help needed to walk in hospital room?: A Little Help needed climbing 3-5 steps with a railing? : A Little 6 Click Score: 20    End of Session Equipment Utilized During Treatment: Gait belt Activity Tolerance: No increased pain;Patient tolerated treatment well Patient left: in  chair;with call bell/phone within reach;with family/visitor present Nurse Communication: Mobility status PT Visit Diagnosis: Unsteadiness on feet (R26.81);Other abnormalities of gait and mobility (R26.89);Muscle weakness (generalized) (M62.81);Difficulty in walking, not elsewhere classified (R26.2);Pain Pain - Right/Left: Right Pain - part of body: Hip;Leg     Time: 8964-8884 PT Time Calculation (min) (ACUTE ONLY): 40 min  Charges:    $Gait Training: 8-22 mins $Therapeutic Exercise: 8-22 mins $Self Care/Home Management: 8-22 PT General Charges $$ ACUTE PT VISIT: 1 Visit                     Darice Potters PT Acute Rehabilitation Services Office [phone]    Potters Darice Norris 02/19/2024, 12:59 PM

## 2024-02-19 NOTE — Plan of Care (Signed)
  Problem: Education: Goal: Knowledge of the prescribed therapeutic regimen will improve Outcome: Progressing Goal: Understanding of discharge needs will improve Outcome: Progressing Goal: Individualized Educational Video(s) Outcome: Progressing   Problem: Activity: Goal: Ability to avoid complications of mobility impairment will improve Outcome: Progressing   

## 2024-02-20 ENCOUNTER — Other Ambulatory Visit (HOSPITAL_BASED_OUTPATIENT_CLINIC_OR_DEPARTMENT_OTHER): Payer: Self-pay

## 2024-02-27 ENCOUNTER — Other Ambulatory Visit (HOSPITAL_BASED_OUTPATIENT_CLINIC_OR_DEPARTMENT_OTHER): Payer: Self-pay

## 2024-02-27 MED ORDER — LEVOTHYROXINE SODIUM 88 MCG PO TABS: 88.0000 ug | ORAL_TABLET | Freq: Every day | ORAL | 4 refills | Status: AC

## 2024-03-06 ENCOUNTER — Other Ambulatory Visit (HOSPITAL_BASED_OUTPATIENT_CLINIC_OR_DEPARTMENT_OTHER): Payer: Self-pay

## 2024-03-06 MED ORDER — CELECOXIB 100 MG PO CAPS
100.0000 mg | ORAL_CAPSULE | Freq: Two times a day (BID) | ORAL | 1 refills | Status: AC
  Filled 2024-03-06: qty 60, 30d supply, fill #0

## 2024-03-07 ENCOUNTER — Other Ambulatory Visit (HOSPITAL_BASED_OUTPATIENT_CLINIC_OR_DEPARTMENT_OTHER): Payer: Self-pay

## 2024-03-08 ENCOUNTER — Other Ambulatory Visit (HOSPITAL_BASED_OUTPATIENT_CLINIC_OR_DEPARTMENT_OTHER): Payer: Self-pay

## 2024-04-09 ENCOUNTER — Other Ambulatory Visit (HOSPITAL_BASED_OUTPATIENT_CLINIC_OR_DEPARTMENT_OTHER): Payer: Self-pay

## 2024-04-09 MED ORDER — CELECOXIB 100 MG PO CAPS
100.0000 mg | ORAL_CAPSULE | Freq: Two times a day (BID) | ORAL | 1 refills | Status: AC
  Filled 2024-04-09: qty 60, 30d supply, fill #0

## 2024-04-10 ENCOUNTER — Other Ambulatory Visit (HOSPITAL_BASED_OUTPATIENT_CLINIC_OR_DEPARTMENT_OTHER): Payer: Self-pay

## 2024-04-21 ENCOUNTER — Other Ambulatory Visit: Payer: Self-pay | Admitting: Cardiovascular Disease

## 2024-04-21 ENCOUNTER — Other Ambulatory Visit (HOSPITAL_BASED_OUTPATIENT_CLINIC_OR_DEPARTMENT_OTHER): Payer: Self-pay | Admitting: Cardiovascular Disease

## 2024-04-21 ENCOUNTER — Other Ambulatory Visit (HOSPITAL_BASED_OUTPATIENT_CLINIC_OR_DEPARTMENT_OTHER): Payer: Self-pay

## 2024-04-21 ENCOUNTER — Other Ambulatory Visit: Payer: Self-pay

## 2024-04-21 MED ORDER — ROSUVASTATIN CALCIUM 20 MG PO TABS
20.0000 mg | ORAL_TABLET | Freq: Every day | ORAL | 3 refills | Status: AC
  Filled 2024-04-21: qty 90, 90d supply, fill #0

## 2024-04-21 MED ORDER — HYDROCHLOROTHIAZIDE 25 MG PO TABS
25.0000 mg | ORAL_TABLET | Freq: Every day | ORAL | 3 refills | Status: AC
  Filled 2024-04-21: qty 90, 90d supply, fill #0

## 2024-04-21 NOTE — Telephone Encounter (Signed)
"   Lipid Panel within 12 months 01/11/24 "

## 2024-04-22 ENCOUNTER — Other Ambulatory Visit (HOSPITAL_BASED_OUTPATIENT_CLINIC_OR_DEPARTMENT_OTHER): Payer: Self-pay

## 2024-04-22 MED ORDER — ROSUVASTATIN CALCIUM 20 MG PO TABS
20.0000 mg | ORAL_TABLET | Freq: Every day | ORAL | 3 refills | Status: AC
  Filled 2024-04-22: qty 90, 90d supply, fill #0
